# Patient Record
Sex: Female | Born: 1970 | Race: Asian | Hispanic: No | Marital: Married | Smoking: Never smoker
Health system: Southern US, Community
[De-identification: ages and names within clinical notes are randomized; demographics above are authoritative.]

## PROBLEM LIST (undated history)

## (undated) DIAGNOSIS — IMO0002 Reserved for concepts with insufficient information to code with codable children: Secondary | ICD-10-CM

## (undated) DIAGNOSIS — R87619 Unspecified abnormal cytological findings in specimens from cervix uteri: Secondary | ICD-10-CM

## (undated) DIAGNOSIS — Z789 Other specified health status: Secondary | ICD-10-CM

## (undated) HISTORY — PX: NO PAST SURGERIES: SHX2092

## (undated) HISTORY — PX: LEEP: SHX91

---

## 2009-10-07 ENCOUNTER — Emergency Department (HOSPITAL_COMMUNITY): Admission: EM | Admit: 2009-10-07 | Discharge: 2009-10-07 | Payer: Self-pay | Admitting: Family Medicine

## 2010-10-31 ENCOUNTER — Emergency Department (HOSPITAL_COMMUNITY)
Admission: EM | Admit: 2010-10-31 | Discharge: 2010-10-31 | Payer: Self-pay | Source: Home / Self Care | Admitting: Emergency Medicine

## 2010-10-31 ENCOUNTER — Emergency Department (HOSPITAL_COMMUNITY)
Admission: EM | Admit: 2010-10-31 | Discharge: 2010-10-31 | Disposition: A | Discharge status: Other Acute Inpatient Hospital | Payer: Self-pay | Source: Home / Self Care | Admitting: Family Medicine

## 2010-10-31 LAB — POCT URINALYSIS DIPSTICK
Nitrite: NEGATIVE
Urine Glucose, Fasting: NEGATIVE mg/dL

## 2010-11-01 LAB — OCCULT BLOOD, POC DEVICE: Fecal Occult Bld: POSITIVE

## 2011-01-28 LAB — RPR: RPR: NONREACTIVE

## 2011-01-28 LAB — ABO/RH

## 2011-01-28 LAB — ANTIBODY SCREEN: Antibody Screen: NEGATIVE

## 2011-01-28 LAB — HIV ANTIBODY (ROUTINE TESTING W REFLEX): HIV: NONREACTIVE

## 2011-02-28 ENCOUNTER — Other Ambulatory Visit: Payer: Self-pay | Admitting: Obstetrics and Gynecology

## 2011-02-28 DIAGNOSIS — Z0489 Encounter for examination and observation for other specified reasons: Secondary | ICD-10-CM

## 2011-02-28 DIAGNOSIS — O09529 Supervision of elderly multigravida, unspecified trimester: Secondary | ICD-10-CM

## 2011-03-26 ENCOUNTER — Ambulatory Visit (HOSPITAL_COMMUNITY)
Admission: RE | Admit: 2011-03-26 | Discharge: 2011-03-26 | Disposition: A | Discharge status: Home / Self Care | Payer: PRIVATE HEALTH INSURANCE | Source: Ambulatory Visit | Attending: Obstetrics and Gynecology | Admitting: Obstetrics and Gynecology

## 2011-03-26 ENCOUNTER — Ambulatory Visit (HOSPITAL_COMMUNITY): Payer: PRIVATE HEALTH INSURANCE

## 2011-03-26 DIAGNOSIS — Z0489 Encounter for examination and observation for other specified reasons: Secondary | ICD-10-CM

## 2011-03-26 DIAGNOSIS — IMO0002 Reserved for concepts with insufficient information to code with codable children: Secondary | ICD-10-CM

## 2011-03-26 DIAGNOSIS — O09519 Supervision of elderly primigravida, unspecified trimester: Secondary | ICD-10-CM | POA: Insufficient documentation

## 2011-03-26 DIAGNOSIS — Z3689 Encounter for other specified antenatal screening: Secondary | ICD-10-CM | POA: Insufficient documentation

## 2011-03-26 DIAGNOSIS — O09529 Supervision of elderly multigravida, unspecified trimester: Secondary | ICD-10-CM

## 2011-03-26 IMAGING — US US OB DETAIL+14 WK
2 series · 14 of 28 positions shown · non-contrast
Comparison: none

[Series 1: us ob detail+14 wk · 17 acquisitions, 2 frames shown (1 of 2)]
[im 5/17]
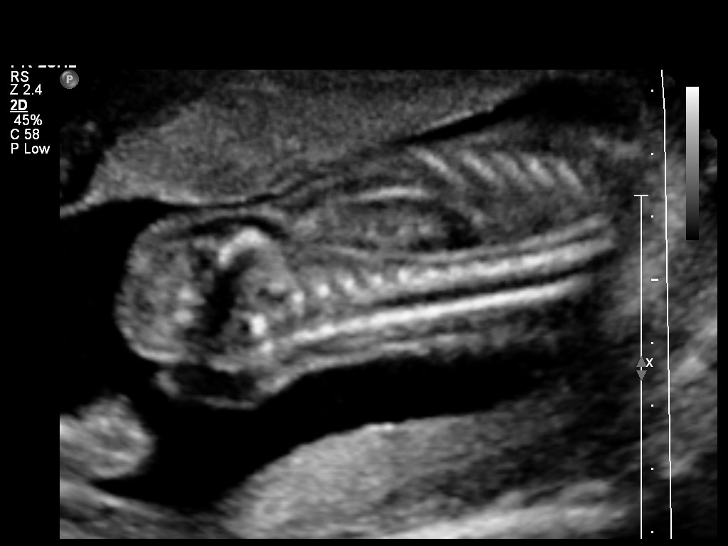
[im 13/17]
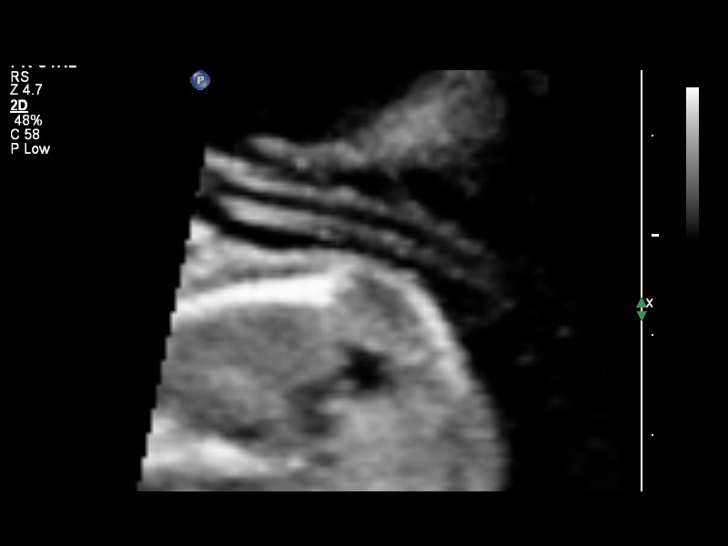

[Series 1: us ob detail+14 wk · 12 of 84 slices shown (2 of 2)]
[im 1/84]
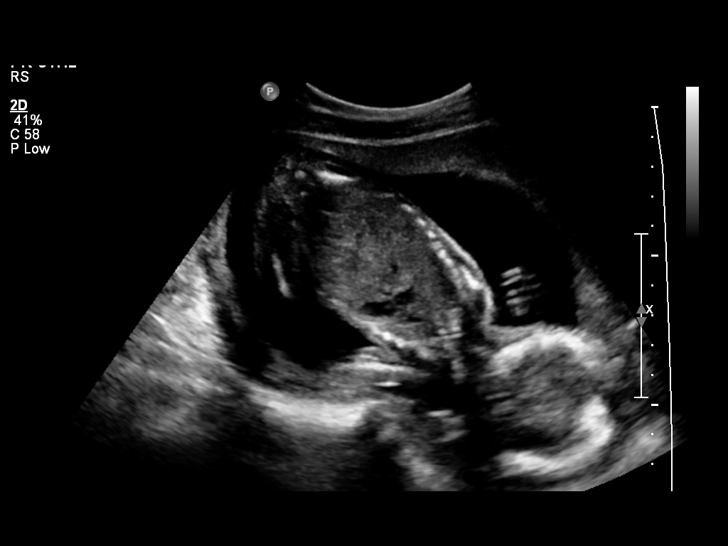
[im 8/84]
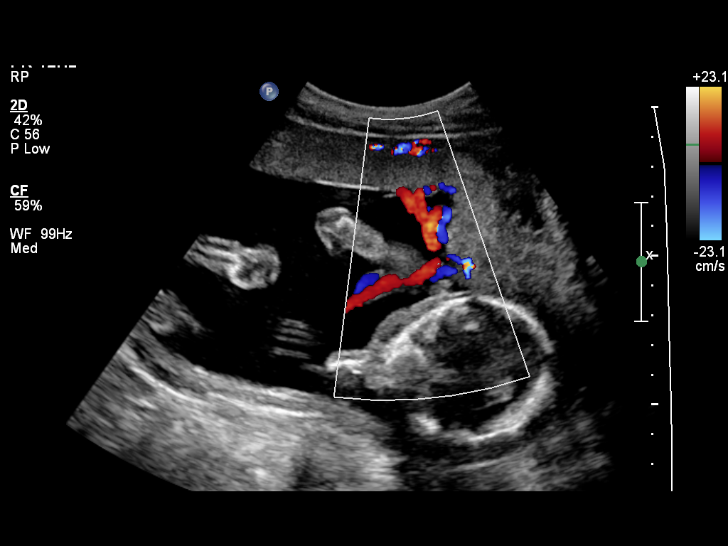
[im 16/84]
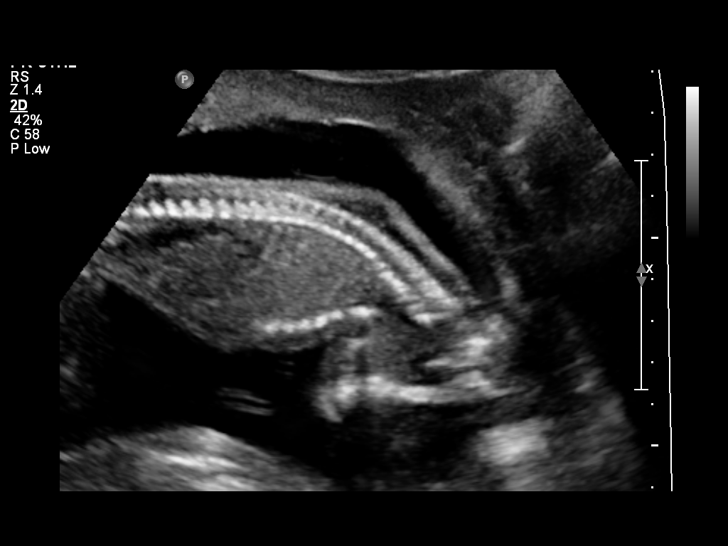
[im 23/84]
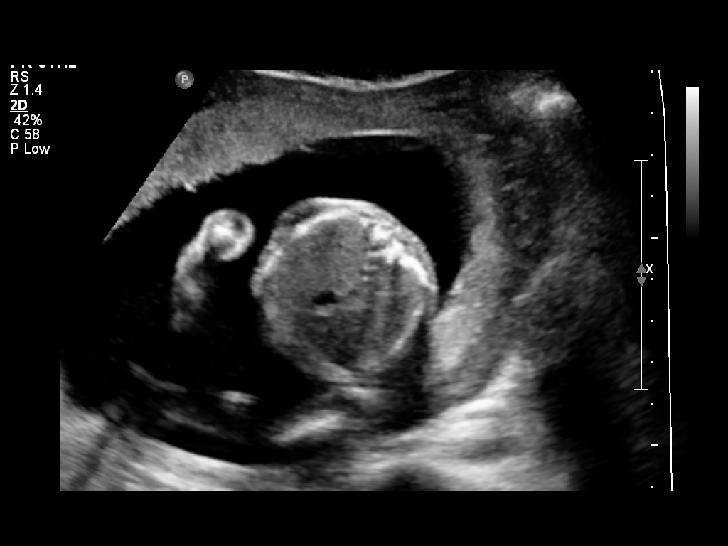
[im 31/84]
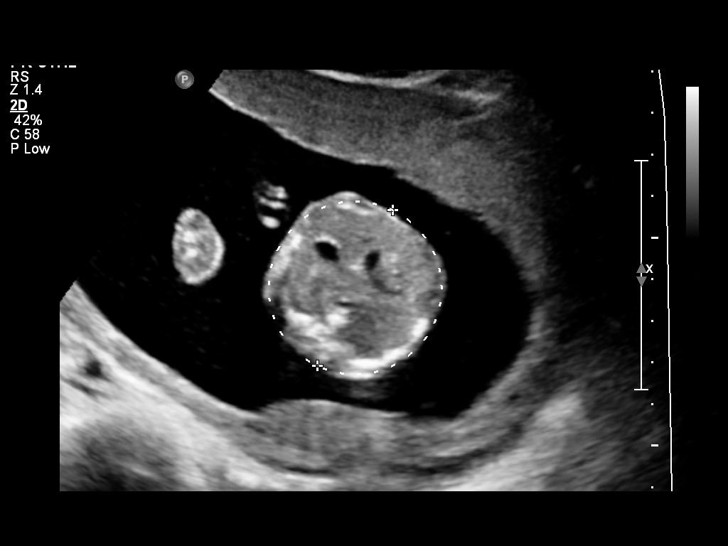
[im 38/84]
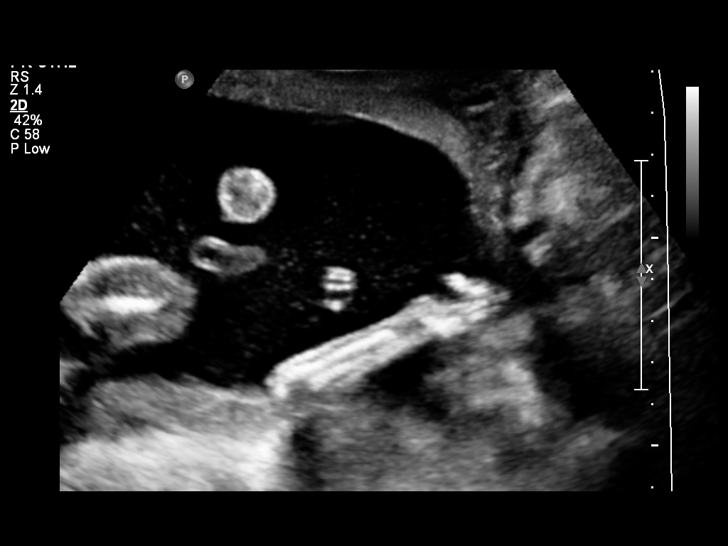
[im 46/84]
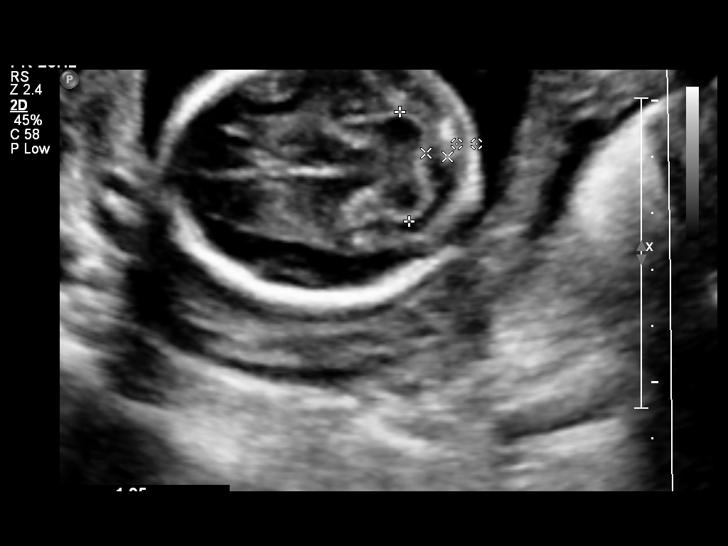
[im 53/84]
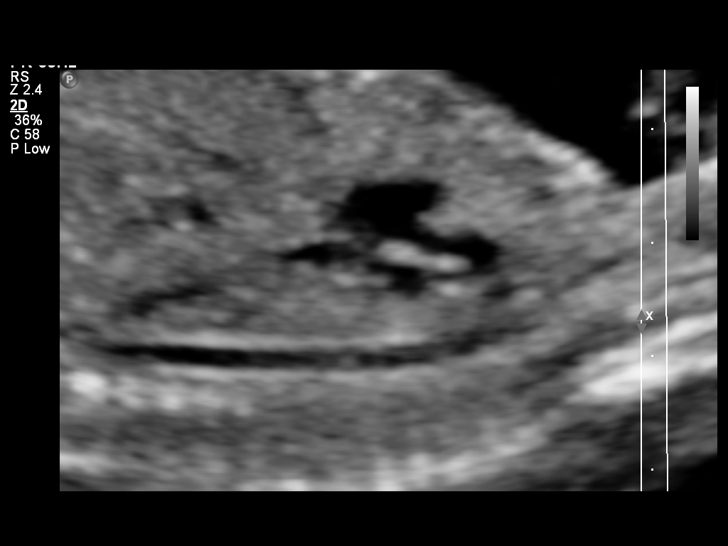
[im 61/84]
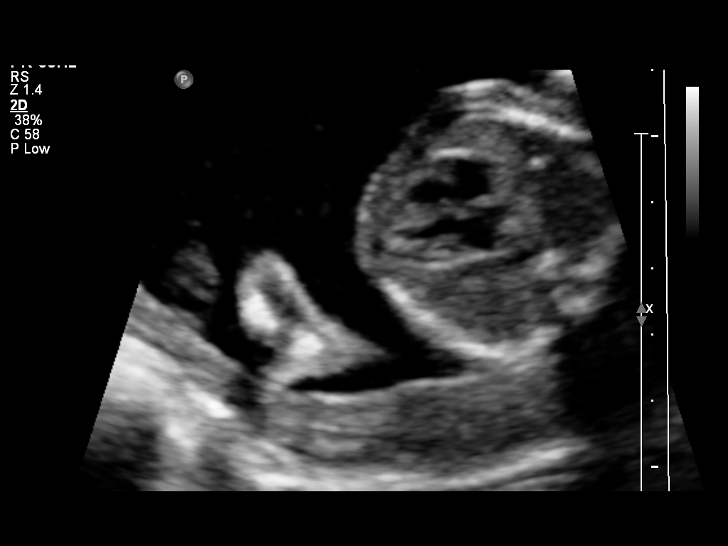
[im 68/84]
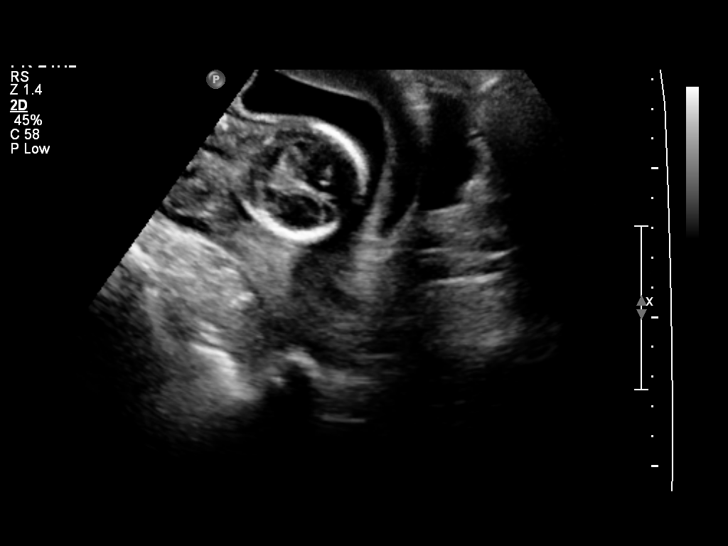
[im 76/84]
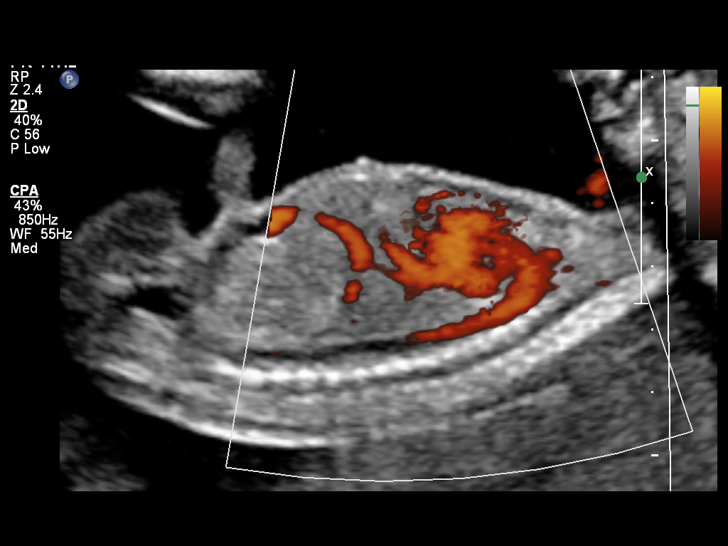
[im 84/84]
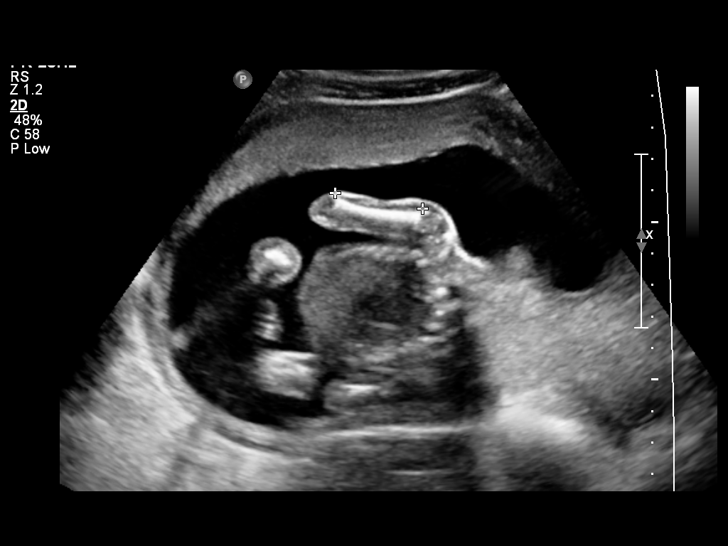

[14 of 28 positions shown; findings below may reference images not displayed]

Canned report from images found in remote index.

Refer to host system for actual result text.

## 2011-07-07 ENCOUNTER — Encounter (HOSPITAL_COMMUNITY): Payer: Self-pay | Admitting: *Deleted

## 2011-07-07 ENCOUNTER — Inpatient Hospital Stay (HOSPITAL_COMMUNITY)
Admission: AD | Admit: 2011-07-07 | Discharge: 2011-07-08 | Disposition: A | Discharge status: Home / Self Care | Payer: PRIVATE HEALTH INSURANCE | Source: Ambulatory Visit | Attending: Obstetrics and Gynecology | Admitting: Obstetrics and Gynecology

## 2011-07-07 DIAGNOSIS — Y92009 Unspecified place in unspecified non-institutional (private) residence as the place of occurrence of the external cause: Secondary | ICD-10-CM | POA: Insufficient documentation

## 2011-07-07 DIAGNOSIS — W010XXA Fall on same level from slipping, tripping and stumbling without subsequent striking against object, initial encounter: Secondary | ICD-10-CM | POA: Insufficient documentation

## 2011-07-07 DIAGNOSIS — O99891 Other specified diseases and conditions complicating pregnancy: Secondary | ICD-10-CM | POA: Insufficient documentation

## 2011-07-07 LAB — URINALYSIS, ROUTINE W REFLEX MICROSCOPIC
Ketones, ur: NEGATIVE mg/dL
Leukocytes, UA: NEGATIVE
Nitrite: NEGATIVE
Protein, ur: NEGATIVE mg/dL

## 2011-07-07 NOTE — H&P (Signed)
Chief Complaint:  Fall at home on wet floor.  HPI:  Patient is at 34wks who stated to me over the phone that she had fallen at approximately 8:30 p.m.  She had reached out her hands and arms and landed on the floor on her buttocks.  She denies abdominal trauma.  She denies any vaginal bleeding, leakage of fluid.  There has been no change in fetal movement.  Patient states she feels occasionally tightening of her lower abdomen, associated with fetal movement.  Patient reports no change in fetal movement since fall.  Vitals:  BP 150's/102 on admission.  This normalized to 130's/70's.  Otherwise, vss.  U/A sent and was negative for protein.  Abd:  Fundus nontender.  Pt thin and easy to monitor.  Palpable and copious audible fetal movement during exam.  NST reviewed and reactive.  No decels noted, multiple accels noted.  + variability.  Baseline approx 120's.    Pelvic exam deferred.  A/P:  Fall without abdominal trauma.  I spoke to the patient regarding her contractions and reassuring fetal status.  I stated that the main concern is that of abruption, which can be a dangerous condition for the patient and the fetus.  However, given her negative abdominal exam and reassuring fetal testing, the risk of this is minimized.  I discussed the possible s/s of abruption with the patient.  I offered her additional testing for the next several hours and if the patient has no complaints at approximately 12 midnight and continued reassuring fetal status, the patient may be discharged home.  She may return to work tomorrow, but if the notices any s/s of abruption, she is to return to the hospital.  I discussed the possibility of these contractions being present prior to the fall and that it is possible to have contractions at 34 weeks generally.  We will continue fetal surveillance for approximately one more hour and if reassuring, may d/c home.  Patient agrees with this plan.

## 2011-07-07 NOTE — Progress Notes (Signed)
Pt states she slipped in the kitchen on a wet spot on the floor and fell against the counter and caught herself on the pantry door and did not hit her stomach but landed on her bottom

## 2011-07-21 LAB — STREP B DNA PROBE: GBS: NEGATIVE

## 2011-08-14 ENCOUNTER — Inpatient Hospital Stay (HOSPITAL_COMMUNITY)
Admission: AD | Admit: 2011-08-14 | Discharge: 2011-08-17 | DRG: 774 | Disposition: A | Discharge status: Home / Self Care | Payer: PRIVATE HEALTH INSURANCE | Source: Ambulatory Visit | Attending: Obstetrics and Gynecology | Admitting: Obstetrics and Gynecology

## 2011-08-14 ENCOUNTER — Inpatient Hospital Stay (HOSPITAL_COMMUNITY): Payer: PRIVATE HEALTH INSURANCE | Admitting: Anesthesiology

## 2011-08-14 ENCOUNTER — Ambulatory Visit: Payer: PRIVATE HEALTH INSURANCE | Admitting: Internal Medicine

## 2011-08-14 ENCOUNTER — Encounter (HOSPITAL_COMMUNITY): Payer: Self-pay | Admitting: Anesthesiology

## 2011-08-14 ENCOUNTER — Encounter (HOSPITAL_COMMUNITY): Payer: Self-pay

## 2011-08-14 DIAGNOSIS — O99892 Other specified diseases and conditions complicating childbirth: Secondary | ICD-10-CM | POA: Diagnosis present

## 2011-08-14 DIAGNOSIS — O09519 Supervision of elderly primigravida, unspecified trimester: Secondary | ICD-10-CM | POA: Diagnosis present

## 2011-08-14 DIAGNOSIS — O1414 Severe pre-eclampsia complicating childbirth: Principal | ICD-10-CM | POA: Diagnosis present

## 2011-08-14 DIAGNOSIS — K208 Other esophagitis without bleeding: Secondary | ICD-10-CM | POA: Diagnosis present

## 2011-08-14 DIAGNOSIS — O212 Late vomiting of pregnancy: Secondary | ICD-10-CM | POA: Diagnosis not present

## 2011-08-14 HISTORY — DX: Unspecified abnormal cytological findings in specimens from cervix uteri: R87.619

## 2011-08-14 HISTORY — DX: Reserved for concepts with insufficient information to code with codable children: IMO0002

## 2011-08-14 HISTORY — DX: Other specified health status: Z78.9

## 2011-08-14 LAB — CBC
HCT: 43.5 % (ref 36.0–46.0)
MCH: 29.9 pg (ref 26.0–34.0)
MCHC: 32.7 g/dL (ref 30.0–36.0)
MCV: 91.5 fL (ref 78.0–100.0)
MCV: 91.4 fL (ref 78.0–100.0)
Platelets: 154 10*3/uL (ref 150–400)
RBC: 4.61 MIL/uL (ref 3.87–5.11)
RBC: 4.76 MIL/uL (ref 3.87–5.11)
RDW: 14.2 % (ref 11.5–15.5)
RDW: 14.2 % (ref 11.5–15.5)
WBC: 19.3 10*3/uL — ABNORMAL HIGH (ref 4.0–10.5)

## 2011-08-14 LAB — URINALYSIS, DIPSTICK ONLY
Glucose, UA: NEGATIVE mg/dL
Protein, ur: NEGATIVE mg/dL
Specific Gravity, Urine: >1.03 — ABNORMAL HIGH (ref 1.005–1.030)
pH: 6 (ref 5.0–8.0)

## 2011-08-14 LAB — COMPREHENSIVE METABOLIC PANEL
AST: 119 U/L — ABNORMAL HIGH (ref 0–37)
Albumin: 2.5 g/dL — ABNORMAL LOW (ref 3.5–5.2)
Albumin: 2.7 g/dL — ABNORMAL LOW (ref 3.5–5.2)
Alkaline Phosphatase: 384 U/L — ABNORMAL HIGH (ref 39–117)
BUN: 18 mg/dL (ref 6–23)
BUN: 17 mg/dL (ref 6–23)
Calcium: 10.1 mg/dL (ref 8.4–10.5)
Calcium: 10.2 mg/dL (ref 8.4–10.5)
Chloride: 99 mEq/L (ref 96–112)
Creatinine, Ser: 1.09 mg/dL (ref 0.50–1.10)
Creatinine, Ser: 1.05 mg/dL (ref 0.50–1.10)
GFR calc Af Amer: 73 mL/min — ABNORMAL LOW (ref >90)
Glucose, Bld: 108 mg/dL — ABNORMAL HIGH (ref 70–99)
Potassium: 4.1 mEq/L (ref 3.5–5.1)
Total Bilirubin: 0.3 mg/dL (ref 0.3–1.2)
Total Protein: 5.9 g/dL — ABNORMAL LOW (ref 6.0–8.3)
Total Protein: 6.5 g/dL (ref 6.0–8.3)

## 2011-08-14 LAB — URIC ACID: Uric Acid, Serum: 6.4 mg/dL (ref 2.4–7.0)

## 2011-08-14 LAB — LACTATE DEHYDROGENASE: LDH: 295 U/L — ABNORMAL HIGH (ref 94–250)

## 2011-08-14 LAB — RPR: RPR Ser Ql: NONREACTIVE

## 2011-08-14 MED ORDER — MAGNESIUM SULFATE BOLUS VIA INFUSION
4.0000 g | Freq: Once | INTRAVENOUS | Status: DC
  Filled 2011-08-14: qty 500

## 2011-08-14 MED ORDER — CITRIC ACID-SODIUM CITRATE 334-500 MG/5ML PO SOLN: 30.0000 mL | ORAL | Status: DC | PRN

## 2011-08-14 MED ORDER — EPHEDRINE 5 MG/ML INJ
10.0000 mg | INTRAVENOUS | Status: DC | PRN
  Filled 2011-08-14: qty 4

## 2011-08-14 MED ORDER — ONDANSETRON HCL 4 MG/2ML IJ SOLN
4.0000 mg | Freq: Four times a day (QID) | INTRAMUSCULAR | Status: DC | PRN
  Administered 2011-08-14 – 2011-08-15 (×2): 4 mg via INTRAVENOUS
  Filled 2011-08-14 (×2): qty 2

## 2011-08-14 MED ORDER — FENTANYL 2.5 MCG/ML BUPIVACAINE 1/10 % EPIDURAL INFUSION (WH - ANES)
14.0000 mL/h | INTRAMUSCULAR | Status: DC
  Administered 2011-08-14 – 2011-08-15 (×3): 14 mL/h via EPIDURAL
  Filled 2011-08-14 (×5): qty 60

## 2011-08-14 MED ORDER — OXYTOCIN 20 UNITS IN LACTATED RINGERS INFUSION - SIMPLE
1.0000 m[IU]/min | INTRAVENOUS | Status: DC
  Administered 2011-08-14: 2 m[IU]/min via INTRAVENOUS
  Administered 2011-08-15: 37.667 m[IU]/min via INTRAVENOUS
  Administered 2011-08-15: 333 m[IU]/min via INTRAVENOUS
  Filled 2011-08-14: qty 1000

## 2011-08-14 MED ORDER — FLEET ENEMA 7-19 GM/118ML RE ENEM: 1.0000 | ENEMA | RECTAL | Status: DC | PRN

## 2011-08-14 MED ORDER — DIPHENHYDRAMINE HCL 50 MG/ML IJ SOLN: 12.5000 mg | INTRAMUSCULAR | Status: DC | PRN

## 2011-08-14 MED ORDER — OXYTOCIN BOLUS FROM INFUSION
500.0000 mL | Freq: Once | INTRAVENOUS | Status: DC
  Filled 2011-08-14: qty 1000
  Filled 2011-08-14: qty 500

## 2011-08-14 MED ORDER — OXYCODONE-ACETAMINOPHEN 5-325 MG PO TABS: 2.0000 | ORAL_TABLET | ORAL | Status: DC | PRN

## 2011-08-14 MED ORDER — BUTORPHANOL TARTRATE 2 MG/ML IJ SOLN
1.0000 mg | INTRAMUSCULAR | Status: DC | PRN
  Administered 2011-08-14 (×2): 1 mg via INTRAVENOUS
  Filled 2011-08-14 (×2): qty 1

## 2011-08-14 MED ORDER — IBUPROFEN 600 MG PO TABS: 600.0000 mg | ORAL_TABLET | Freq: Four times a day (QID) | ORAL | Status: DC | PRN

## 2011-08-14 MED ORDER — LIDOCAINE HCL 1.5 % IJ SOLN
INTRAMUSCULAR | Status: DC | PRN
  Administered 2011-08-14 (×2): 5 mL via EPIDURAL

## 2011-08-14 MED ORDER — LACTATED RINGERS IV SOLN
INTRAVENOUS | Status: DC
  Administered 2011-08-14 – 2011-08-15 (×3): via INTRAVENOUS

## 2011-08-14 MED ORDER — LACTATED RINGERS IV SOLN: 500.0000 mL | Freq: Once | INTRAVENOUS | Status: DC

## 2011-08-14 MED ORDER — ACETAMINOPHEN 325 MG PO TABS: 650.0000 mg | ORAL_TABLET | ORAL | Status: DC | PRN

## 2011-08-14 MED ORDER — LACTATED RINGERS IV SOLN
500.0000 mL | INTRAVENOUS | Status: DC | PRN
  Administered 2011-08-14: 500 mL via INTRAVENOUS

## 2011-08-14 MED ORDER — OXYTOCIN 20 UNITS IN LACTATED RINGERS INFUSION - SIMPLE: 125.0000 mL/h | Freq: Once | INTRAVENOUS | Status: DC

## 2011-08-14 MED ORDER — TERBUTALINE SULFATE 1 MG/ML IJ SOLN: 0.2500 mg | Freq: Once | INTRAMUSCULAR | Status: AC | PRN

## 2011-08-14 MED ORDER — EPHEDRINE 5 MG/ML INJ: 10.0000 mg | INTRAVENOUS | Status: DC | PRN

## 2011-08-14 MED ORDER — LIDOCAINE HCL (PF) 1 % IJ SOLN: 30.0000 mL | INTRAMUSCULAR | Status: DC | PRN

## 2011-08-14 MED ORDER — MAGNESIUM SULFATE 40 G IN LACTATED RINGERS - SIMPLE
1.0000 g/h | INTRAVENOUS | Status: DC
  Administered 2011-08-14: 2 g/h via INTRAVENOUS
  Administered 2011-08-15 (×3): 1 g/h via INTRAVENOUS
  Filled 2011-08-14: qty 500

## 2011-08-14 MED ORDER — PHENYLEPHRINE 40 MCG/ML (10ML) SYRINGE FOR IV PUSH (FOR BLOOD PRESSURE SUPPORT)
80.0000 ug | PREFILLED_SYRINGE | INTRAVENOUS | Status: DC | PRN
  Filled 2011-08-14: qty 5

## 2011-08-14 MED ORDER — PHENYLEPHRINE 40 MCG/ML (10ML) SYRINGE FOR IV PUSH (FOR BLOOD PRESSURE SUPPORT): 80.0000 ug | PREFILLED_SYRINGE | INTRAVENOUS | Status: DC | PRN

## 2011-08-14 NOTE — Anesthesia Preprocedure Evaluation (Signed)
Anesthesia Evaluation  Patient identified by MRN, date of birth, ID band Patient awake    Reviewed: Allergy & Precautions, H&P , Patient's Chart, lab work & pertinent test results  Airway Mallampati: II TM Distance: >3 FB Neck ROM: full    Dental No notable dental hx.    Pulmonary neg pulmonary ROS,  clear to auscultation  Pulmonary exam normal       Cardiovascular neg cardio ROS regular Normal    Neuro/Psych Negative Neurological ROS  Negative Psych ROS   GI/Hepatic negative GI ROS, Neg liver ROS,   Endo/Other  Negative Endocrine ROS  Renal/GU negative Renal ROS     Musculoskeletal   Abdominal   Peds  Hematology negative hematology ROS (+)   Anesthesia Other Findings Severe preeclampsia  Reproductive/Obstetrics (+) Pregnancy                           Anesthesia Physical Anesthesia Plan  ASA: III  Anesthesia Plan: Epidural   Post-op Pain Management:    Induction:   Airway Management Planned:   Additional Equipment:   Intra-op Plan:   Post-operative Plan:   Informed Consent: I have reviewed the patients History and Physical, chart, labs and discussed the procedure including the risks, benefits and alternatives for the proposed anesthesia with the patient or authorized representative who has indicated his/her understanding and acceptance.     Plan Discussed with:   Anesthesia Plan Comments:         Anesthesia Quick Evaluation

## 2011-08-14 NOTE — Progress Notes (Signed)
PT HAS ROOM ASSIGNMENT 173. MD WILL PLACE ORDERS IN 10 MIN. H MITCHELL CHARGE ON RN NOTIFIED.

## 2011-08-14 NOTE — Progress Notes (Signed)
B/P 140-150/DIAST 110/115 REACTIVE STRIP. MD WILL PLACE ORDERS FOR ADMIT.

## 2011-08-14 NOTE — Progress Notes (Signed)
Pt states on tx she feels a gush of warm fluid. Small amount of pink, clear fluid seen on pad. Cleone Slim RN notified.

## 2011-08-14 NOTE — Progress Notes (Addendum)
Helen Russell is a 40 y.o. G1P0 at [redacted]w[redacted]d admitted for severe preeclampsia by blood pressure and labs.  Started on Magnesium Sulfate at 1800. Subjective: Pt denies HA, visual changes, abdominal pain other than contractions.  She reports nausea/vomiting at the time of mechanical cervical dilatation.  Objective: BP 148/73  Pulse 80  Temp(Src) 98 F (36.7 C) (Oral)  Resp 18  Ht 5' 6.5" (1.689 m)  Wt 65.772 kg (145 lb)  BMI 23.05 kg/m2  SpO2 98%   Total I/O In: 120 [P.O.:120] Out: -   FHT:  FHR: 110s bpm, variability: moderate,  accelerations:  Present,  decelerations:  Present mild variables UC:   Unable to assess due to pt positioning.  Q 1-3 after IUPC placed SVE:   Dilation: Fingertip Effacement (%): 70 Station: -1 Exam by:: Montez Morita, RNC  2-3/90/-1, occiput well applied.  IUPC placed without difficulty. Lungs: CTA bilaterally Abdomen: no RUQ tenderness Neuro: 2-3 + DTR  Labs: Lab Results  Component Value Date   WBC 11.8* 08/14/2011   HGB 13.8 08/14/2011   HCT 42.2 08/14/2011   MCV 91.5 08/14/2011   PLT 154 08/14/2011    Assessment / Plan: Severe preeclampsia  Labor: On pitocin.  No change in 2 hours. IUPC placed to monitor pattern and MVUs. Preeclampsia:  on magnesium sulfate, no signs or symptoms of toxicity and Repeating labs now. Fetal Wellbeing:  Category I Pain Control:  Stadol for now.  Pt counseled on contraindication of epidural with thrombocytopenia. I/D:  n/a Anticipated MOD:  NSVD  Deatrice Spanbauer 08/14/2011, 7:52 PM

## 2011-08-14 NOTE — H&P (Signed)
Admission H&P:  Patient is a 40 y.o.G1P0 at [redacted]w[redacted]d who presented to the clinic today for routine exam and was noted to have bp's of 156/106 with repeat of 138/90.  Patient was noted to have reactive NST (routine testing given AMA).  Referred to hospital for induction of labor for PIH v. PreX.  No change in vision, h/a, change in vision.    PMH:  Rubella non-immune.  Allergic to rubbing alcohol. PSH:  LEEP PGYN:  + h/o abnormal paps.  No h/o other STD's.  POB:  G1P0. Allergies:  Rubbing alcohol. Soc:  No a/t/d/ Fam:  Non-contrib.    Prenatal Labs:  O+/RNI/NR/pap wnl.  BP max 164 systolic, 115 diastolic.  More recently decreased to 153/83, 164/94, 154/99, 142/101.   Otherwise vss and wnl. Bedside u/s:  Cephalic. Cervix closed with a small dimple-like structure at os; small tuft of hair visible through os.  80%, -1.   Leop[place]:  approx 7 lbs.    Procedure Note:  Given scar tissue noted on exam likely secondary to a LEEP, after discussing with patient the option of breaking up this tissue, the patient opted to proceed with disruption thereof.  After cephalic position was confirmed by u/s, sterile speculum was inserted into vagina.  Copious clear fluid in the vagina drained.  Hair per os then noted.  Ring forceps inserted under direct visualization of os but these were too wide for os to accommodate.  Kelley clamp then gently inserted and opened to max of 2 cm horizontally and vertically with care to avoid performing this action during a contraction so that fetal head would not be under pressure against the cervix.  Mild amount of bleeding noted from cervix.  After speculum removed, cervix noted to be 1 cm.  Process repeated again under direct visualization with speculum and cervix thereafter noted to 2 cm/90/-1.  Reactive NST.  + accels, no current decels.  + variability.  1 possibly prolonged decel previously.    Labs:  Plt 154.  AST  118, ALT 163.  Cr. 1.09.  Uric A 6.5  A/P:  1)  Term  pregnancy with PreX/HELLP  Induction with pitocin. Magnesium 4 g load then 2 g / hr written. Monitor strict I's and O's.   2)  H/o LEEP:  Scar tissue disrupted and patient dilated from closed to 2 cm.    3)  GBS neg:  No prophylaxis recommended.  4)  Rubella non-immune.  Immunize postpartum.  5)  Pain:  Discussed IV meds v. Epidural.  Given low plt, d/w Dr. Dion Body:  Desires repeat labs prior to ensure no decrease in Plt count.    This patient was discussed with Dr. Dion Body and signed out to her.

## 2011-08-14 NOTE — Progress Notes (Signed)
Patient states she was seen in the office this am and sent for IOL. Has had elevated blood pressure and bloody show. Patient will be held in MAU until bed availble in BS.

## 2011-08-14 NOTE — Anesthesia Procedure Notes (Signed)

## 2011-08-15 ENCOUNTER — Encounter (HOSPITAL_COMMUNITY): Payer: Self-pay | Admitting: *Deleted

## 2011-08-15 LAB — COMPREHENSIVE METABOLIC PANEL
AST: 70 U/L — ABNORMAL HIGH (ref 0–37)
Albumin: 2 g/dL — ABNORMAL LOW (ref 3.5–5.2)
Chloride: 99 mEq/L (ref 96–112)
Creatinine, Ser: 1.46 mg/dL — ABNORMAL HIGH (ref 0.50–1.10)
Potassium: 4 mEq/L (ref 3.5–5.1)
Total Bilirubin: 0.2 mg/dL — ABNORMAL LOW (ref 0.3–1.2)

## 2011-08-15 LAB — CBC
HCT: 36.7 % (ref 36.0–46.0)
MCHC: 32.4 g/dL (ref 30.0–36.0)
MCV: 92.3 fL (ref 78.0–100.0)
Platelets: 161 10*3/uL (ref 150–400)
Platelets: 149 10*3/uL — ABNORMAL LOW (ref 150–400)
RDW: 14.6 % (ref 11.5–15.5)
RDW: 14.4 % (ref 11.5–15.5)
WBC: 26.5 10*3/uL — ABNORMAL HIGH (ref 4.0–10.5)

## 2011-08-15 LAB — DIFFERENTIAL
Basophils Relative: 0 % (ref 0–1)
Eosinophils Relative: 0 % (ref 0–5)
Monocytes Relative: 2 % — ABNORMAL LOW (ref 3–12)
Myelocytes: 0 %
Neutrophils Relative %: 90 % — ABNORMAL HIGH (ref 43–77)

## 2011-08-15 LAB — MAGNESIUM: Magnesium: 8 mg/dL (ref 1.5–2.5)

## 2011-08-15 LAB — ABO/RH: ABO/RH(D): O POS

## 2011-08-15 MED ORDER — MEASLES, MUMPS & RUBELLA VAC ~~LOC~~ INJ
0.5000 mL | INJECTION | Freq: Once | SUBCUTANEOUS | Status: AC
  Administered 2011-08-18: 0.5 mL via SUBCUTANEOUS
  Filled 2011-08-15: qty 0.5

## 2011-08-15 MED ORDER — TETANUS-DIPHTH-ACELL PERTUSSIS 5-2.5-18.5 LF-MCG/0.5 IM SUSP
0.5000 mL | Freq: Once | INTRAMUSCULAR | Status: DC
  Filled 2011-08-15: qty 0.5

## 2011-08-15 MED ORDER — MAGNESIUM SULFATE 40 G IN LACTATED RINGERS - SIMPLE
1.0000 g/h | INTRAVENOUS | Status: DC
  Administered 2011-08-15: 1 g/h via INTRAVENOUS
  Filled 2011-08-15 (×2): qty 500

## 2011-08-15 MED ORDER — HYDRALAZINE HCL 20 MG/ML IJ SOLN: 10.0000 mg | INTRAMUSCULAR | Status: DC | PRN

## 2011-08-15 MED ORDER — SIMETHICONE 80 MG PO CHEW: 80.0000 mg | CHEWABLE_TABLET | ORAL | Status: DC | PRN

## 2011-08-15 MED ORDER — SENNOSIDES-DOCUSATE SODIUM 8.6-50 MG PO TABS
2.0000 | ORAL_TABLET | Freq: Every day | ORAL | Status: DC
  Administered 2011-08-15 – 2011-08-16 (×2): 2 via ORAL

## 2011-08-15 MED ORDER — IBUPROFEN 600 MG PO TABS: 600.0000 mg | ORAL_TABLET | Freq: Four times a day (QID) | ORAL | Status: DC

## 2011-08-15 MED ORDER — DIPHENHYDRAMINE HCL 25 MG PO CAPS: 25.0000 mg | ORAL_CAPSULE | Freq: Four times a day (QID) | ORAL | Status: DC | PRN

## 2011-08-15 MED ORDER — DIBUCAINE 1 % RE OINT: 1.0000 "application " | TOPICAL_OINTMENT | RECTAL | Status: DC | PRN

## 2011-08-15 MED ORDER — PRENATAL PLUS 27-1 MG PO TABS
1.0000 | ORAL_TABLET | Freq: Every day | ORAL | Status: DC
  Administered 2011-08-16 – 2011-08-18 (×2): 1 via ORAL
  Filled 2011-08-15 (×2): qty 1

## 2011-08-15 MED ORDER — LANOLIN HYDROUS EX OINT: TOPICAL_OINTMENT | CUTANEOUS | Status: DC | PRN

## 2011-08-15 MED ORDER — WITCH HAZEL-GLYCERIN EX PADS: 1.0000 "application " | MEDICATED_PAD | CUTANEOUS | Status: DC | PRN

## 2011-08-15 MED ORDER — ONDANSETRON HCL 4 MG PO TABS: 4.0000 mg | ORAL_TABLET | ORAL | Status: DC | PRN

## 2011-08-15 MED ORDER — ONDANSETRON HCL 4 MG/2ML IJ SOLN: 4.0000 mg | INTRAMUSCULAR | Status: DC | PRN

## 2011-08-15 MED ORDER — CARBOPROST TROMETHAMINE 250 MCG/ML IM SOLN
INTRAMUSCULAR | Status: AC
  Filled 2011-08-15: qty 1

## 2011-08-15 MED ORDER — MEDROXYPROGESTERONE ACETATE 150 MG/ML IM SUSP: 150.0000 mg | INTRAMUSCULAR | Status: DC | PRN

## 2011-08-15 MED ORDER — HYDRALAZINE HCL 20 MG/ML IJ SOLN: 10.0000 mg | INTRAMUSCULAR | Status: DC

## 2011-08-15 MED ORDER — BENZOCAINE-MENTHOL 20-0.5 % EX AERO: 1.0000 "application " | INHALATION_SPRAY | CUTANEOUS | Status: DC | PRN

## 2011-08-15 MED ORDER — LABETALOL HCL 5 MG/ML IV SOLN: 10.0000 mg | Freq: Once | INTRAVENOUS | Status: DC

## 2011-08-15 MED ORDER — OXYCODONE-ACETAMINOPHEN 5-325 MG PO TABS
1.0000 | ORAL_TABLET | ORAL | Status: DC | PRN
  Administered 2011-08-16 (×2): 1 via ORAL
  Filled 2011-08-15 (×2): qty 1

## 2011-08-15 MED ORDER — OXYTOCIN 10 UNIT/ML IJ SOLN
INTRAMUSCULAR | Status: AC
  Filled 2011-08-15: qty 1

## 2011-08-15 MED ORDER — MISOPROSTOL 200 MCG PO TABS
ORAL_TABLET | ORAL | Status: AC
  Filled 2011-08-15: qty 5

## 2011-08-15 MED ORDER — CEFAZOLIN SODIUM 1-5 GM-% IV SOLN
1.0000 g | Freq: Three times a day (TID) | INTRAVENOUS | Status: DC
  Administered 2011-08-15 – 2011-08-17 (×5): 1 g via INTRAVENOUS
  Filled 2011-08-15 (×6): qty 50

## 2011-08-15 MED ORDER — ZOLPIDEM TARTRATE 5 MG PO TABS: 5.0000 mg | ORAL_TABLET | Freq: Every evening | ORAL | Status: DC | PRN

## 2011-08-15 MED ORDER — LACTATED RINGERS IV SOLN
INTRAVENOUS | Status: DC
  Administered 2011-08-15 – 2011-08-17 (×3): via INTRAVENOUS

## 2011-08-15 NOTE — Progress Notes (Signed)
UR chart review completed.  

## 2011-08-15 NOTE — Progress Notes (Signed)
Helen Russell is a 40 y.o. G1P0 at [redacted]w[redacted]d  admitted for induction of labor due to Pre-eclamptic toxemia of pregnancy, severe.  Subjective: Pt sleeping quietly.  Denies HA, visual changes.  Reports one more episode of emesis.   Objective: BP 124/69  Pulse 88  Temp(Src) 97.7 F (36.5 C) (Oral)  Resp 18  Ht 5' 6.5" (1.689 m)  Wt 65.772 kg (145 lb)  BMI 23.05 kg/m2  SpO2 98% I/O last 3 completed shifts: In: 698 [I.V.:698] Out: -  Total I/O In: 2516.8 [P.O.:660; I.V.:1856.8] Out: 1050 [Urine:1050]  100 in the last hour  FHT:  FHR: 140s bpm, variability: moderate,  accelerations:  Present,  decelerations:  Present late decelerations resolved UC:   Tripling.  MVU 170 in the last hour  SVE:   Dilation: 7.5 Effacement (%): 90 Station: +1 Exam by:: H.Norton, RN  Above exam was done at 6:20am  Labs: Lab Results  Component Value Date   WBC 19.3* 08/14/2011   HGB 14.4 08/14/2011   HCT 43.5 08/14/2011   MCV 91.4 08/14/2011   PLT 148* 08/14/2011    Assessment / Plan: Protracted active phase Check magnesium level.  Increase Pitocin until adequate MVUs.  Labor: protracted but inadequate MVUs. Preeclampsia:  on magnesium sulfate and adequate UOP.  Due to somnolence and inadequate MVUs will check stat Mg level. Fetal Wellbeing:  Category I Pain Control:  Epidural I/D:  Prolonged ROM.  No s/s chorioamnionits. Anticipated MOD:  NSVD if adequate contractions.  Helen Russell 08/15/2011, 6:44 AM

## 2011-08-15 NOTE — Consult Note (Signed)
Called to AICU for patient with high blood pressure and vomiting upon transfer from L&D to AICU.  Patient had SVD with epidural earlier today and has preeclampsia on magnesium drip.  She arrived in AICU vomiting, shivering and with decreased LOC.  On my arrival BP was 198/118 and 161/119 with HR 113, patient was sedated but arousable and oriented.  I ordered a dose of labetalol IV 10 mg. BP lowered to 142/82 and HR to 88.  Patient more alert though still sedated.  No difficulty breathing, o2 sat 98% on RA.  Dr Neva Seat has been called.  Care over to Dr. Neva Seat and [facility] staff.  Jasmine December, MD

## 2011-08-15 NOTE — Significant Event (Signed)
Pt transferred to AICU via wheelchair @ 1500.  Pt began to vomit large amout of brown liquid and became very lethargic.  Pt brought to AICU bed and was lethargic but arousable with a little bit of generalized shaking noted.  Dr. Rodman Pickle called to room and Dr Chilton Si notified.  Magnesium level called while patient in transfer and was reported to be 8.0.  Magnesium down to 1mg  per MD order.  Tia Alert, RNC assumed care.

## 2011-08-15 NOTE — Progress Notes (Addendum)
Post Partum Day 0  Subjective:  Mental status change, hypertension, vomiting blood On route to AICU for postpartum magnesium sulfate prophylaxis for PIH, patient became obtunded and hypertensive (200/100) after an episode of nausea and vomiting of blood.  She also gave a history of vomiting dark fluid 4 to 5 times yesterday.   Objective: Blood pressure 148/94, pulse 101, temperature 99 F (37.2 C), temperature source Oral, resp. rate 18, height 5' 6.5" (1.689 m), weight 145 lb (65.772 kg), SpO2 98.00%, unknown if currently breastfeeding.  Currently the patient is oriented, and appropriate in responses.  BP has decreased with Labetolol 10 mg IV x1.  Physical Exam:  HEENT nl Lungs clear S1 S2 Clear abd BS present Lochia: appropriate Uterine Fundus:  Firm Episiotomy, laceration : nl DVT Evaluation:  negative   Basename 08/15/11 1404 08/14/11 2015  HGB 11.9* 14.4  HCT 36.7 43.5    Assessment/Plan: S/p NSVD around noon today Severe PIH, on Magnesium Sulfate Nausea and vomiting heme colored material x 2 days P: Continue Magnesium sulfate CBC, CMP GI consult Dr Janifer Adie called.   LOS: 1 day   Ilya Ess E 08/15/2011, 5:00 PM    9

## 2011-08-16 DIAGNOSIS — D62 Acute posthemorrhagic anemia: Secondary | ICD-10-CM

## 2011-08-16 DIAGNOSIS — K922 Gastrointestinal hemorrhage, unspecified: Secondary | ICD-10-CM

## 2011-08-16 LAB — COMPREHENSIVE METABOLIC PANEL
ALT: 65 U/L — ABNORMAL HIGH (ref 0–35)
Alkaline Phosphatase: 228 U/L — ABNORMAL HIGH (ref 39–117)
BUN: 19 mg/dL (ref 6–23)
CO2: 28 mEq/L (ref 19–32)
Chloride: 99 mEq/L (ref 96–112)
GFR calc Af Amer: 62 mL/min — ABNORMAL LOW (ref >90)
Glucose, Bld: 113 mg/dL — ABNORMAL HIGH (ref 70–99)
Potassium: 3.9 mEq/L (ref 3.5–5.1)
Total Bilirubin: 0.2 mg/dL — ABNORMAL LOW (ref 0.3–1.2)

## 2011-08-16 LAB — CBC
HCT: 28.2 % — ABNORMAL LOW (ref 36.0–46.0)
Hemoglobin: 9.4 g/dL — ABNORMAL LOW (ref 12.0–15.0)
MCHC: 33.3 g/dL (ref 30.0–36.0)
RBC: 3.07 MIL/uL — ABNORMAL LOW (ref 3.87–5.11)
WBC: 18 10*3/uL — ABNORMAL HIGH (ref 4.0–10.5)

## 2011-08-16 MED ORDER — SODIUM CHLORIDE 0.9 % IJ SOLN
3.0000 mL | Freq: Two times a day (BID) | INTRAMUSCULAR | Status: DC
  Administered 2011-08-16: 3 mL via INTRAVENOUS

## 2011-08-16 MED ORDER — PANTOPRAZOLE SODIUM 40 MG PO TBEC
40.0000 mg | DELAYED_RELEASE_TABLET | Freq: Every day | ORAL | Status: DC
  Administered 2011-08-16 – 2011-08-18 (×3): 40 mg via ORAL
  Filled 2011-08-16 (×4): qty 1

## 2011-08-16 MED ORDER — PANTOPRAZOLE SODIUM 40 MG PO TBEC
40.0000 mg | DELAYED_RELEASE_TABLET | Freq: Every day | ORAL | Status: DC
  Filled 2011-08-16: qty 1

## 2011-08-16 NOTE — Progress Notes (Addendum)
Post Partum Day 1 Subjective: no complaints today.  GI Physician assistant here.  Objective: Blood pressure 132/64, pulse 101, temperature 98.7 F (37.1 C), temperature source Oral, resp. rate 20, height 5' 6.5" (1.689 m), weight 145 lb (65.772 kg), SpO2 99.00%, unknown if currently breastfeeding.  Physical Exam:  General: alert, cooperative and no distress Lochia: appropriate Uterine Fundus: firm Episiotomy, laceration : no significant drainage DVT Evaluation: No evidence of DVT seen on physical exam.   Basename 08/16/11 0539 08/15/11 1713  HGB 9.4* 11.4*  HCT 28.2* 34.7*  AST 46 (as 70)  ALT 65 (was 103) Creatinine 1.25 (was 1.46)  Assessment/Plan: GI consult obtained due to vomiting dark emesis for past two days.  P: Endoscopy in the morning. Severe PIH improving Plan for discharge tomorrow after endoscopy. Transfer to postpartum.    LOS: 2 days   Mersadies Petree E 08/16/2011, 12:43 PM

## 2011-08-16 NOTE — Anesthesia Postprocedure Evaluation (Signed)
  Anesthesia Post-op Note  Patient: Helen Russell  Procedure(s) Performed: * No procedures listed *  Patient Location: [facility] Unit  Anesthesia Type: Epidural  Level of Consciousness: awake and alert   Airway and Oxygen Therapy: Patient Spontanous Breathing  Post-op Assessment: Patient's Cardiovascular Status Stable and Respiratory Function Stable  Post-op Vital Signs: Reviewed and stable  Complications: No apparent anesthesia complications

## 2011-08-16 NOTE — Consult Note (Signed)
Referring Provider: Dr. Almyra Free Care Physician:  No primary provider on file. Primary Gastroenterologist: unassigned  Reason for Consultation:  Vomiting blood  HPI: Helen Russell is a 40 y.o. female who delivered a baby boy yesterday. She was admitted on 08/14/2011 with pregnancy-induced hypertension, preeclampsia. She relates having onset of nausea and vomiting on Thursday and apparently vomited multiple times each of these episodes with black or dark brown material, no bright red blood. Yesterday after she delivered her baby she was getting up in the wheelchair to transfer floors and became nauseated again and vomited up a large amount of black material. She says she hasn't had any further vomiting since then feels much better today. She has not had a bowel movement since 08/14/2011. She has eaten today and denies any heartburn indigestion dysphagia or odynophagia. She had been having some heartburn over the past month and had been using times when necessary. She had not been using any aspirin or NSAIDs. She has no prior history of any GI problems.  Hemoglobin on admission on 11 8 was 14.4 hemoglobin today is 9.4.   Past Medical History  Diagnosis Date  . No pertinent past medical history   . Abnormal Pap smear     LEEP 1996    Past Surgical History  Procedure Date  . Leep     1996  . No past surgeries     Prior to Admission medications   Not on File    Current Facility-Administered Medications  Medication Dose Route Frequency Provider Last Rate Last Dose  . benzocaine-Menthol (DERMOPLAST) 20-0.5 % topical spray 1 application  1 application Topical PRN Fortino Sic, MD      . ceFAZolin (ANCEF) IVPB 1 g/50 mL premix  1 g Intravenous Q8H Fortino Sic, MD   1 g at 08/16/11 1047  . witch hazel-glycerin (TUCKS) pad 1 application  1 application Topical PRN Fortino Sic, MD       And  . dibucaine (NUPERCAINAL) 1 % rectal ointment 1 application  1  application Rectal PRN Fortino Sic, MD      . diphenhydrAMINE (BENADRYL) capsule 25 mg  25 mg Oral Q6H PRN Fortino Sic, MD      . hydrALAZINE (APRESOLINE) injection 10 mg  10 mg Intravenous Q20 Min PRN Fortino Sic, MD      . labetalol (NORMODYNE,TRANDATE) injection 10 mg  10 mg Intravenous Once Amy L. Rodman Pickle, MD      . lactated ringers infusion   Intravenous Continuous Fortino Sic, MD 112.5 mL/hr at 08/16/11 0800    . lanolin ointment   Topical PRN Fortino Sic, MD      . magnesium sulfate 40 grams in LR 500 mL infusion  1 g/hr Intravenous Continuous Fortino Sic, MD 12.5 mL/hr at 08/16/11 0800 1 g/hr at 08/16/11 0800  . measles, mumps and rubella vaccine (MMR) injection 0.5 mL  0.5 mL Subcutaneous Once Fortino Sic, MD      . medroxyPROGESTERone (DEPO-PROVERA) injection 150 mg  150 mg Intramuscular Prior to discharge Fortino Sic, MD      . ondansetron [facility]) tablet 4 mg  4 mg Oral Q4H PRN Fortino Sic, MD       Or  . ondansetron [facility]) injection 4 mg  4 mg Intravenous Q4H PRN Fortino Sic, MD      . oxyCODONE-acetaminophen (PERCOCET) 5-325 MG per tablet 1-2 tablet  1-2 tablet Oral Q3H PRN Lou Miner  Neva Seat, MD      . prenatal vitamin w/FE, FA (PRENATAL 1 + 1) 27-1 MG tablet 1 tablet  1 tablet Oral Daily Fortino Sic, MD   1 tablet at 08/16/11 1040  . senna-docusate (Senokot-S) tablet 2 tablet  2 tablet Oral QHS Fortino Sic, MD   2 tablet at 08/15/11 2205  . simethicone (MYLICON) chewable tablet 80 mg  80 mg Oral PRN Fortino Sic, MD      . TDaP Leda Min) injection 0.5 mL  0.5 mL Intramuscular Once Fortino Sic, MD      . zolpidem [facility]) tablet 5 mg  5 mg Oral QHS PRN Fortino Sic, MD      . DISCONTD: acetaminophen (TYLENOL) tablet 650 mg  650 mg Oral Q4H PRN Pricilla Holm, MD      . DISCONTD: butorphanol (STADOL) injection 1 mg  1 mg Intravenous Q1H PRN Pricilla Holm, MD   1 mg at 08/14/11 1955  . DISCONTD: carboprost  (HEMABATE) 250 MCG/ML injection           . DISCONTD: citric acid-sodium citrate (ORACIT) solution 30 mL  30 mL Oral Q2H PRN Pricilla Holm, MD      . DISCONTD: diphenhydrAMINE (BENADRYL) injection 12.5 mg  12.5 mg Intravenous Q15 min PRN Jiles Garter, MD      . DISCONTD: ePHEDrine injection 10 mg  10 mg Intravenous PRN Jiles Garter, MD      . DISCONTD: ePHEDrine injection 10 mg  10 mg Intravenous PRN Jiles Garter, MD      . DISCONTD: fentaNYL 2.5 mcg/ml w/bupivacaine 0.1% epidural (60 ml syringe)  14 mL/hr Epidural Continuous Jiles Garter, MD   14 mL/hr at 08/15/11 0700  . DISCONTD: hydrALAZINE (APRESOLINE) injection 10 mg  10 mg Intravenous Q20 Min Fortino Sic, MD      . DISCONTD: ibuprofen (ADVIL,MOTRIN) tablet 600 mg  600 mg Oral Q6H PRN Pricilla Holm, MD      . DISCONTD: ibuprofen (ADVIL,MOTRIN) tablet 600 mg  600 mg Oral Q6H Fortino Sic, MD      . DISCONTD: lactated ringers infusion 500 mL  500 mL Intravenous Once Jiles Garter, MD      . DISCONTD: lactated ringers infusion 500-1,000 mL  500-1,000 mL Intravenous PRN Pricilla Holm, MD 1,000 mL/hr at 08/14/11 2146 500 mL at 08/14/11 2146  . DISCONTD: lactated ringers infusion   Intravenous Continuous Pricilla Holm, MD      . DISCONTD: lidocaine (XYLOCAINE) 1 % injection 30 mL  30 mL Subcutaneous PRN Pricilla Holm, MD      . DISCONTD: magnesium bolus via infusion 4 g  4 g Intravenous Once Pricilla Holm, MD      . DISCONTD: magnesium sulfate 40 grams in LR 500 mL infusion  1 g/hr Intravenous Titrated Fortino Sic, MD 12.5 mL/hr at 08/15/11 2200 1 g/hr at 08/15/11 2200  . DISCONTD: misoprostol (CYTOTEC) 200 MCG tablet           . DISCONTD: ondansetron (ZOFRAN) injection 4 mg  4 mg Intravenous Q6H PRN Pricilla Holm, MD   4 mg at 08/15/11 0038  . DISCONTD: oxyCODONE-acetaminophen (PERCOCET) 5-325 MG per tablet 2 tablet  2 tablet Oral Q3H PRN Pricilla Holm, MD      . DISCONTD: oxytocin (PITOCIN) 10  UNIT/ML injection           . DISCONTD: oxytocin (PITOCIN) IV BOLUS FROM BAG  500 mL Intravenous Once [employer],  MD      . DISCONTD: oxytocin (PITOCIN) IV infusion 20 units in LR 1000 mL  125 mL/hr Intravenous Once Pricilla Holm, MD      . DISCONTD: oxytocin (PITOCIN) IV infusion 20 units in LR 1000 mL  1-40 milli-units/min Intravenous Titrated Pricilla Holm, MD 113 mL/hr at 08/15/11 1517 37.667 milli-units/min at 08/15/11 1517  . DISCONTD: phenylephrine injection 80 mcg  80 mcg Intravenous PRN Jiles Garter, MD      . DISCONTD: phenylephrine injection 80 mcg  80 mcg Intravenous PRN Jiles Garter, MD      . DISCONTD: sodium phosphate (FLEET) 7-19 GM/118ML enema 1 enema  1 enema Rectal PRN Pricilla Holm, MD       Facility-Administered Medications Ordered in Other Encounters  Medication Dose Route Frequency Provider Last Rate Last Dose  . DISCONTD: lidocaine 1.5 % injection    PRN Velna Hatchet, MD   5 mL at 08/14/11 2234    Allergies as of 08/14/2011 - Review Complete 08/14/2011  Allergen Reaction Noted  . Ethanol  08/14/2011  . Rubbing alcohol (alcohol) Rash 07/07/2011    History reviewed. No pertinent family history.  History   Social History  . Marital Status: Single    Spouse Name: N/A    Number of Children: N/A  . Years of Education: N/A   Occupational History  . Not on file.   Social History Main Topics  . Smoking status: Never Smoker   . Smokeless tobacco: Never Used  . Alcohol Use: No  . Drug Use: No  . Sexually Active: Yes    Birth Control/ Protection: None   Other Topics Concern  . Not on file   Social History Narrative  . No narrative on file    Review of Systems: Gen: Denies any fever, chills, sweats, anorexia, fatigue, weakness, malaise, weight loss, and sleep disorder CV: Denies chest pain, angina, palpitations, syncope, orthopnea, PND, peripheral edema, and claudication. Resp: Denies dyspnea at rest, dyspnea with exercise, cough,  sputum, wheezing, coughing up blood, and pleurisy. GI:see hpi GU : Denies urinary burning, blood in urine, urinary frequency, urinary hesitancy, nocturnal urination, and urinary incontinence. MS: Denies joint pain, limitation of movement, and swelling, stiffness, low back pain, extremity pain.    Psych: Denies depression, anxiety, memory loss, suicidal ideation, hallucinations, paranoia, and confusion. Heme: Denies bruising, bleeding, and enlarged lymph nodes. Neuro:  Denies any headaches, dizziness, paresthesias.   Physical Exam: Vital signs in last 24 hours: Temp:  [97.9 F (36.6 C)-99 F (37.2 C)] 98.1 F (36.7 C) (11/10 0412) Pulse Rate:  [77-151] 79  (11/10 1040) Resp:  [16-30] 18  (11/10 1040) BP: (111-178)/(67-146) 148/87 mmHg (11/10 1028) SpO2:  [97 %-100 %] 100 % (11/10 1040) Last BM Date: 08/14/11 General:   Alert,  Well-developed, well-nourished, pleasant and cooperative in NAD Head:  Normocephalic and atraumatic. Eyes:  Sclera clear, no icterus.   Conjunctiva pink. Ears:  Normal auditory acuity. Nose:  No deformity, discharge,  or lesions. Mouth:  No deformity or lesions.  Oropharynx pink & moist. Neck:  Supple; no masses or thyromegaly. Lungs:  Clear throughout to auscultation.   No wheezes, crackles, or rhonchi. No acute distress. Heart:  Regular rate and rhythm; no murmurs, clicks, rubs,  or gallops. Abdomen:  Soft, nontender and nondistended. No masses, hepatosplenomegaly or hernias noted. Normal bowel sounds, without guarding, and without rebound.   Rectal: not done Msk:  Symmetrical without gross deformities. Normal posture. Pulses:  Normal pulses noted.  Extremities:  Without clubbing or edema. Neurologic:  Alert and  oriented x4;  grossly normal neurologically. .  Intake/Output from previous day: 11/09 0701 - 11/10 0700 In: 5438.6 [P.O.:2000; I.V.:3338.6; IV Piggyback:100] Out: 4485 [Urine:3485; Emesis/NG output:500; Blood:500] Intake/Output this  shift: Total I/O In: 980 [P.O.:480; I.V.:500] Out: 1725 [Urine:1725]  Lab Results:  Basename 08/16/11 0539 08/15/11 1713 08/15/11 1404  WBC 18.0* 26.5* 27.2*  HGB 9.4* 11.4* 11.9*  HCT 28.2* 34.7* 36.7  PLT 121* 149* 161   BMET  Basename 08/16/11 0539 08/15/11 1713 08/14/11 2015  NA 132* 133* 132*  K 3.9 4.0 3.9  CL 99 99 99  CO2 28 24 22   GLUCOSE 113* 155* 80  BUN 19 20 17   CREATININE 1.25* 1.46* 1.05  CALCIUM 6.5* 7.6* 10.2   LFT  Basename 08/16/11 0539  PROT 4.3*  ALBUMIN 1.7*  AST 46*  ALT 65*  ALKPHOS 228*  BILITOT 0.2*  BILIDIR --  IBILI --   PT/INR No results found for this basename: LABPROT:2,INR:2 in the last 72 hours        Impression: #8 40 year old female day #1 status post vaginal delivery yesterday 08/15/2011. Patient had complication of pregnancy-induced hypertension/eclampsia. She is improving today and plan is for her to be weaned off of magnesium and transferred to a regular bed.  #2 Coffee-ground emesis , related to above. Rule out Mallory-Weiss tear erosive esophagitis versus gastritis. Patient is stable without any evidence of active bleeding over the past 24-hour line  #3 anemia normocytic secondary to acute blood loss, this may be multifactorial in this setting of delivery yesterday.  Plan: #1 Discussed with the patient and her family and will schedule for upper endoscopy with Dr. Arlyce Dice at [facility] Sunday morning 08/17/2011. She will have moderate sedation. She will need to dump her breastmilk for the remainder of that day #2 start Protonix 40 mg by mouth daily, and would anticipate discharging her home on a PPI. Further plans pending findings of upper endoscopy.   Amy Esterwood  08/16/2011, 11:18 AM  Chart was reviewed and patient was examined. X-rays were reviewed.    I agree with management and plans. Imp - Acute, limited UGI bleed as evidenced by coffee ground emesis.  Suspect MW tear vs esophagitis/ gastritis.  Plan  as above.  Barbette Hair. Arlyce Dice, M.D., [facility]

## 2011-08-16 NOTE — Addendum Note (Signed)
Addendum  created 08/16/11 0742 by Edison Pace, CRNA   Modules edited:Charges VN, Notes Section

## 2011-08-16 NOTE — Progress Notes (Signed)
08/16/11 1155 magnesium & foley d/c'd. 1640 SBar report called to Mcarthur Rossetti, RN for pt transfer to [facility] rm #[id number].

## 2011-08-16 NOTE — Progress Notes (Signed)
Delivery Note At 11:39 AM a viable female was delivered via Vaginal, Outlet Forceps (Presentation: ; Occiput Anterior).  APGAR: 9, 9; weight 5 lb 13.3 oz (2645 g).   Placenta status: , .  Cord: 3 vessels with the following complications: .  Anesthesia: Epidural  Episiotomy: Median, 2nd degree Lacerations: none Suture Repair: 2-0 chromic Est. Blood Loss (mL): 500  Mom to AICU for magnesium sulfate prophylaxis and close observation for severe PIH.  Baby to nursery-stable.  Helen Russell 08/16/2011, 1:41 PM

## 2011-08-16 NOTE — Progress Notes (Signed)

## 2011-08-17 ENCOUNTER — Encounter (HOSPITAL_COMMUNITY)
Admission: AD | Disposition: A | Discharge status: Home / Self Care | Payer: Self-pay | Source: Ambulatory Visit | Attending: Obstetrics and Gynecology

## 2011-08-17 ENCOUNTER — Encounter (HOSPITAL_COMMUNITY): Payer: Self-pay | Admitting: *Deleted

## 2011-08-17 DIAGNOSIS — K209 Esophagitis, unspecified: Secondary | ICD-10-CM

## 2011-08-17 HISTORY — PX: ESOPHAGOGASTRODUODENOSCOPY: SHX5428

## 2011-08-17 LAB — COMPREHENSIVE METABOLIC PANEL
ALT: 44 U/L — ABNORMAL HIGH (ref 0–35)
AST: 35 U/L (ref 0–37)
Calcium: 7.6 mg/dL — ABNORMAL LOW (ref 8.4–10.5)
Creatinine, Ser: 0.96 mg/dL (ref 0.50–1.10)
GFR calc Af Amer: 85 mL/min — ABNORMAL LOW (ref >90)
GFR calc non Af Amer: 74 mL/min — ABNORMAL LOW (ref >90)
Glucose, Bld: 70 mg/dL (ref 70–99)
Sodium: 132 mEq/L — ABNORMAL LOW (ref 135–145)
Total Protein: 4.8 g/dL — ABNORMAL LOW (ref 6.0–8.3)

## 2011-08-17 LAB — CBC
MCH: 30.1 pg (ref 26.0–34.0)
MCHC: 32.2 g/dL (ref 30.0–36.0)
MCV: 93.2 fL (ref 78.0–100.0)
Platelets: 133 10*3/uL — ABNORMAL LOW (ref 150–400)

## 2011-08-17 SURGERY — EGD (ESOPHAGOGASTRODUODENOSCOPY)
Anesthesia: Moderate Sedation

## 2011-08-17 MED ORDER — FERROUS SULFATE 325 (65 FE) MG PO TABS
325.0000 mg | ORAL_TABLET | Freq: Two times a day (BID) | ORAL | Status: DC
  Administered 2011-08-17 – 2011-08-18 (×2): 325 mg via ORAL
  Filled 2011-08-17 (×2): qty 1

## 2011-08-17 MED ORDER — MIDAZOLAM HCL 5 MG/5ML IJ SOLN
INTRAMUSCULAR | Status: DC | PRN
  Administered 2011-08-17 (×2): 2 mg via INTRAVENOUS

## 2011-08-17 MED ORDER — FENTANYL NICU IV SYRINGE 50 MCG/ML
INJECTION | INTRAMUSCULAR | Status: DC | PRN
  Administered 2011-08-17 (×2): 25 ug via INTRAVENOUS

## 2011-08-17 MED ORDER — BISACODYL 10 MG RE SUPP
10.0000 mg | Freq: Once | RECTAL | Status: AC
  Administered 2011-08-17: 10 mg via RECTAL
  Filled 2011-08-17: qty 1

## 2011-08-17 MED ORDER — DIPHENHYDRAMINE HCL 50 MG/ML IJ SOLN
INTRAMUSCULAR | Status: AC
  Filled 2011-08-17: qty 1

## 2011-08-17 MED ORDER — FENTANYL CITRATE 0.05 MG/ML IJ SOLN
INTRAMUSCULAR | Status: AC
  Filled 2011-08-17: qty 4

## 2011-08-17 MED ORDER — BUTAMBEN-TETRACAINE-BENZOCAINE 2-2-14 % EX AERO
INHALATION_SPRAY | CUTANEOUS | Status: DC | PRN
  Administered 2011-08-17: 2 via TOPICAL

## 2011-08-17 MED ORDER — MIDAZOLAM HCL 10 MG/2ML IJ SOLN
INTRAMUSCULAR | Status: AC
  Filled 2011-08-17: qty 4

## 2011-08-17 MED ORDER — GLYCOPYRROLATE 0.2 MG/ML IJ SOLN
INTRAMUSCULAR | Status: AC
  Filled 2011-08-17: qty 1

## 2011-08-17 MED ORDER — CEPHALEXIN 500 MG PO CAPS
500.0000 mg | ORAL_CAPSULE | Freq: Three times a day (TID) | ORAL | Status: DC
  Administered 2011-08-17 – 2011-08-18 (×3): 500 mg via ORAL
  Filled 2011-08-17 (×7): qty 1

## 2011-08-17 NOTE — Progress Notes (Signed)
Post Partum Day 2 Subjective: no complaints Pt is s/p upper endoscopy this morning.  Findings: erosive esophagitis  Objective: Blood pressure 152/92, pulse 70, temperature 98.2 F (36.8 C), temperature source Oral, resp. rate 16, height 5\' 6"  (1.676 m), weight 145 lb (65.772 kg), SpO2 99.00%, unknown if currently breastfeeding.  Physical Exam:  General: alert, cooperative and no distress Lochia: appropriate Uterine Fundus: firm Episiotomy, laceration : no significant drainage DVT Evaluation: No evidence of DVT seen on physical exam.   Basename 08/17/11 0515 08/16/11 0539  HGB 8.9* 9.4*  HCT 27.6* 28.2*  AST/ALT 35/44 WBC 16,000    plt 133,000  Creatinine 0.89  Calcium wnl  Assessment/Plan:  1.  PIH continues to improve as BPs are normal without meds.  Also Liver enzymes and creatinine are correcting .  2.  WBC elevations are improving.  No temperature elevation.  Will switch to po antibiotic. Suspect source of elevation is erosive esophagitis. 3.  S/P endoscopy, stable.  May discharge later today or in the morning.   LOS: 3 days   GREENE,ELEANOR E 08/17/2011, 12:19 PM

## 2011-08-17 NOTE — H&P (View-Only) (Signed)
Called to AICU for patient with high blood pressure and vomiting upon transfer from L&D to AICU.  Patient had SVD with epidural earlier today and has preeclampsia on magnesium drip.  She arrived in AICU vomiting, shivering and with decreased LOC.  On my arrival BP was 198/118 and 161/119 with HR 113, patient was sedated but arousable and oriented.  I ordered a dose of labetalol IV 10 mg. BP lowered to 142/82 and HR to 88.  Patient more alert though still sedated.  No difficulty breathing, o2 sat 98% on RA.  Dr Greene has been called.  Care over to Dr. Greene and AICU staff.  A. Arista Kettlewell, MD 

## 2011-08-17 NOTE — Progress Notes (Deleted)
I spoke with Ovidio Hanger (pharmd) at [facility] as pt had questions concerning the use of Fentanyl and Versed that will be used for sedation in EGD. Per Dewayne Hatch with the max quanity of Fentanyl and 10mg  of Versed, as a precaution pt should not pump or breast feed within 4 hrs of receiving these drugs, After the 4 hrs pt can breast feed infant without discarding her milk. Explained this with pt. And she verbalized understanding. s tedder Theola Sequin

## 2011-08-17 NOTE — Interval H&P Note (Signed)
History and Physical Interval Note:   08/17/2011   9:05 AM   Helen Russell  has presented today for surgery, with the diagnosis of hematemesis  The various methods of treatment have been discussed with the patient and family. After consideration of risks, benefits and other options for treatment, the patient has consented to  Procedure(s): ESOPHAGOGASTRODUODENOSCOPY (EGD) as a surgical intervention .  The patients' history has been reviewed, patient examined, no change in status, stable for surgery.  I have reviewed the patients' chart and labs.  Questions were answered to the patient's satisfaction.     Melvia Heaps  MD

## 2011-08-17 NOTE — Progress Notes (Signed)
Upper endoscopy demonstrated grade B. erosions in the distal esophagus.  This is very likely the source for her GI bleeding.  Recommendations 1) 4-6 week course of PPI therapy

## 2011-08-17 NOTE — Progress Notes (Signed)
I spoke with Helen Russell (pharmd) at [facility] as pt had questions concerning the use of Fentanyl and Versed that will be used for sedation in EGD. Per Helen with the max quanity of 100mcg Fentanyl and 10mg of Versed, as a precaution pt should not pump or breast feed within 4 hrs of receiving these drugs, After the 4 hrs pt can breast feed infant without discarding her milk. Explained this with pt. And she verbalized understanding. s Sabre Leonetti rn 

## 2011-08-18 LAB — CBC
HCT: 32.2 % — ABNORMAL LOW (ref 36.0–46.0)
MCH: 29.8 pg (ref 26.0–34.0)
MCV: 93.1 fL (ref 78.0–100.0)
RDW: 14.4 % (ref 11.5–15.5)
WBC: 14.7 10*3/uL — ABNORMAL HIGH (ref 4.0–10.5)

## 2011-08-18 LAB — DIFFERENTIAL
Basophils Absolute: 0 10*3/uL (ref 0.0–0.1)
Eosinophils Relative: 2 % (ref 0–5)
Lymphocytes Relative: 19 % (ref 12–46)
Monocytes Absolute: 0.7 10*3/uL (ref 0.1–1.0)

## 2011-08-18 LAB — COMPREHENSIVE METABOLIC PANEL
AST: 75 U/L — ABNORMAL HIGH (ref 0–37)
CO2: 26 mEq/L (ref 19–32)
Calcium: 9.3 mg/dL (ref 8.4–10.5)
Creatinine, Ser: 0.79 mg/dL (ref 0.50–1.10)
GFR calc Af Amer: >90 mL/min (ref >90)
GFR calc non Af Amer: >90 mL/min (ref >90)
Glucose, Bld: 73 mg/dL (ref 70–99)

## 2011-08-18 NOTE — Discharge Summary (Signed)
Obstetric Discharge Summary Reason for Admission: onset of labor Prenatal Procedures: none Intrapartum Procedures: spontaneous vaginal delivery Postpartum Procedures: upper endoscopy Complications-Operative and Postpartum: hypertensive and syncopal episode resolved with labetolol Hemoglobin  Date Value Range Status  08/18/2011 10.3* 12.0-15.0 (g/dL) Final     HCT  Date Value Range Status  08/18/2011 32.2* 36.0-46.0 (%) Final    Discharge Diagnoses: Term Pregnancy-delivered                                          Severe Preeclampsia                                          Erosive esophagitis  Discharge Information: Date: 08/18/2011 Activity: pelvic rest Diet: routine Medications: Ibuprofen, Iron and aldomet and protonix Condition: stable Instructions: refer to practice specific booklet Discharge to: home   Newborn Data: Live born female  Birth Weight: 5 lb 13.3 oz (2645 g) APGAR: 9, 9  Home with mother.  Fortino Sic 08/18/2011, 12:25 PM

## 2011-08-18 NOTE — Progress Notes (Signed)
Called [facility] regarding follow up for Ms. Helen Russell. She had a Esophagogastroduodenoscopy on 08/17/11.   I was given Amy Esterwoods number. She returned my call stating patient just need to take OTC Prilosec for 4 to 6 weeks.  No further follow was needed with Dr. Arlyce Dice. Will relay to patient on discharge instructions.

## 2011-08-18 NOTE — Progress Notes (Signed)
Post Partum Day 3 Subjective: no complaints, voiding and tolerating PO  Objective: Blood pressure 152/94, pulse 71, temperature 98.5 F (36.9 C), temperature source Oral, resp. rate 20, height 5\' 6"  (1.676 m), weight 145 lb (65.772 kg), SpO2 99.00%, unknown if currently breastfeeding.  Physical Exam:  General: alert and no distress Lochia: appropriate Uterine Fundus: firm Episiotomy, laceration : no significant drainage DVT Evaluation: No evidence of DVT seen on physical exam.   Basename 08/18/11 0825 08/17/11 0515  HGB 10.3* 8.9*  HCT 32.2* 27.6*    Assessment/Plan: Discharge home   LOS: 4 days   Helen Russell E 08/18/2011, 12:31 PM

## 2011-08-29 ENCOUNTER — Encounter (HOSPITAL_COMMUNITY): Payer: Self-pay | Admitting: Gastroenterology

## 2012-01-27 ENCOUNTER — Other Ambulatory Visit (HOSPITAL_COMMUNITY)
Admission: RE | Admit: 2012-01-27 | Discharge: 2012-01-27 | Disposition: A | Discharge status: Home / Self Care | Payer: PRIVATE HEALTH INSURANCE | Source: Ambulatory Visit | Attending: Obstetrics and Gynecology | Admitting: Obstetrics and Gynecology

## 2012-01-27 DIAGNOSIS — Z124 Encounter for screening for malignant neoplasm of cervix: Secondary | ICD-10-CM | POA: Insufficient documentation

## 2012-01-27 DIAGNOSIS — Z1159 Encounter for screening for other viral diseases: Secondary | ICD-10-CM | POA: Insufficient documentation

## 2012-02-16 ENCOUNTER — Ambulatory Visit: Payer: PRIVATE HEALTH INSURANCE | Admitting: Family

## 2012-09-08 ENCOUNTER — Ambulatory Visit: Payer: PRIVATE HEALTH INSURANCE | Admitting: Family

## 2013-04-27 ENCOUNTER — Other Ambulatory Visit: Payer: Self-pay | Admitting: Obstetrics and Gynecology

## 2013-04-27 DIAGNOSIS — Z1231 Encounter for screening mammogram for malignant neoplasm of breast: Secondary | ICD-10-CM

## 2013-05-16 ENCOUNTER — Ambulatory Visit
Admission: RE | Admit: 2013-05-16 | Discharge: 2013-05-16 | Disposition: A | Discharge status: Home / Self Care | Payer: PRIVATE HEALTH INSURANCE | Source: Ambulatory Visit | Attending: Obstetrics and Gynecology | Admitting: Obstetrics and Gynecology

## 2013-05-16 DIAGNOSIS — Z1231 Encounter for screening mammogram for malignant neoplasm of breast: Secondary | ICD-10-CM

## 2014-04-20 ENCOUNTER — Other Ambulatory Visit: Payer: Self-pay

## 2014-04-20 DIAGNOSIS — Z1231 Encounter for screening mammogram for malignant neoplasm of breast: Secondary | ICD-10-CM

## 2014-05-18 ENCOUNTER — Ambulatory Visit
Admission: RE | Admit: 2014-05-18 | Discharge: 2014-05-18 | Disposition: A | Discharge status: Home / Self Care | Payer: Managed Care, Other (non HMO) | Source: Ambulatory Visit

## 2014-05-18 ENCOUNTER — Encounter (INDEPENDENT_AMBULATORY_CARE_PROVIDER_SITE_OTHER): Payer: Self-pay

## 2014-05-18 DIAGNOSIS — Z1231 Encounter for screening mammogram for malignant neoplasm of breast: Secondary | ICD-10-CM

## 2014-08-07 ENCOUNTER — Other Ambulatory Visit: Payer: Self-pay | Admitting: Physician Assistant

## 2014-08-07 ENCOUNTER — Encounter (HOSPITAL_COMMUNITY): Payer: Self-pay | Admitting: Gastroenterology

## 2014-08-07 ENCOUNTER — Other Ambulatory Visit (HOSPITAL_COMMUNITY)
Admission: RE | Admit: 2014-08-07 | Discharge: 2014-08-07 | Disposition: A | Discharge status: Home / Self Care | Payer: Managed Care, Other (non HMO) | Source: Ambulatory Visit | Attending: Family Medicine | Admitting: Family Medicine

## 2014-08-07 DIAGNOSIS — Z124 Encounter for screening for malignant neoplasm of cervix: Secondary | ICD-10-CM | POA: Diagnosis not present

## 2014-08-09 LAB — CYTOLOGY - PAP

## 2015-03-19 ENCOUNTER — Encounter: Payer: Self-pay | Admitting: Neurology

## 2015-03-19 ENCOUNTER — Ambulatory Visit (INDEPENDENT_AMBULATORY_CARE_PROVIDER_SITE_OTHER): Payer: Managed Care, Other (non HMO) | Admitting: Neurology

## 2015-03-19 VITALS — BP 148/101 | HR 81 | Temp 98.1°F | Ht 66.75 in | Wt 129.0 lb

## 2015-03-19 DIAGNOSIS — H538 Other visual disturbances: Secondary | ICD-10-CM

## 2015-03-19 DIAGNOSIS — R519 Headache, unspecified: Secondary | ICD-10-CM | POA: Insufficient documentation

## 2015-03-19 DIAGNOSIS — G4489 Other headache syndrome: Secondary | ICD-10-CM | POA: Diagnosis not present

## 2015-03-19 DIAGNOSIS — R51 Headache: Secondary | ICD-10-CM

## 2015-03-19 DIAGNOSIS — R42 Dizziness and giddiness: Secondary | ICD-10-CM | POA: Diagnosis not present

## 2015-03-19 MED ORDER — SUMATRIPTAN-NAPROXEN SODIUM 85-500 MG PO TABS: 1.0000 | ORAL_TABLET | ORAL | Status: AC | PRN

## 2015-03-19 MED ORDER — METHYLPREDNISOLONE 4 MG PO TBPK: ORAL_TABLET | ORAL | Status: DC

## 2015-03-19 NOTE — Progress Notes (Signed)
[facility]    Provider:  Dr Lucia Gaskins Referring Provider: Maurice Small, MD Primary Care Physician:  REDMON,NOELLE, PA-C  CC:  headache  HPI:  Helen Russell is a 44 y.o. female here as a referral from Dr. Valentina Lucks for headache. She has pain behind her left eye. Started a month ago after her prescription for her eyes changed. She started getting dizzy and the whole room shifted when she got up. She is having daily episodes of room spinning. When she rolls over to the right, she can feel her symptoms. Feels like she is on a boat. She has an uncomfortable feeling on the left side of the head, she feels off balance, she has neck tightness and soreness and heaviness in her neck. The headache comes and goes daily. Sometimes it feels like her head is going to pop. No photophobia, no phonophobia. More pressure in the eye. The dizziness is moreso when changing positions. Like when she gets up very fast. Vertigo is brief. No nausea, no hearing changes, +blurry and vision changes. She feels like she is still on the airplane, off balance. No inciting events other than change in vision as stated above. No family members with migraines. No weakness. No paresthesias. The meclizine didn't help. No other focal neurologic symptoms. She is under stress at work and is not sleeping well.    Reviewed notes, labs and imaging from outside physicians, which showed: tsh wnl,   Review of Systems: Patient complains of symptoms per HPI as well as the following symptoms: headache, dizziness, not enough sleep Pertinent negatives per HPI. All others negative.   History   Social History  . Marital Status: Married    Spouse Name: N/A  . Number of Children: 1  . Years of Education: 12   Occupational History  . [employer]     Social History Main Topics  . Smoking status: Never Smoker   . Smokeless tobacco: Never Used  . Alcohol Use: No  . Drug Use: No  . Sexual Activity: Yes    Birth Control/  Protection: None   Other Topics Concern  . Not on file   Social History Narrative   Lives at home with spouse.   Caffeine use: 1 or 2 per week (coffee)    Family History  Problem Relation Age of Onset  . Diabetes Mother   . High blood pressure Mother   . Diverticulitis Mother     Past Medical History  Diagnosis Date  . No pertinent past medical history   . Abnormal Pap smear     LEEP 1996    Past Surgical History  Procedure Laterality Date  . Leep      1996  . No past surgeries    . Esophagogastroduodenoscopy  08/17/2011    Procedure: ESOPHAGOGASTRODUODENOSCOPY (EGD);  Surgeon: Louis Meckel, MD;  Location: [facility];  Service: Endoscopy;  Laterality: N/A;    Current Outpatient Prescriptions  Medication Sig Dispense Refill  . Meclizine HCl (BONINE PO) Take 25 mg by mouth as needed.     . methylPREDNISolone (MEDROL DOSEPAK) 4 MG TBPK tablet As directed 21 tablet 0  . SUMAtriptan-naproxen (TREXIMET) 85-500 MG per tablet Take 1 tablet by mouth every 2 (two) hours as needed for migraine. 2 tablet 0   No current facility-administered medications for this visit.    Allergies as of 03/19/2015 - Review Complete 03/19/2015  Allergen Reaction Noted  . Ethanol  08/14/2011  . Rubbing alcohol [alcohol] Rash 07/07/2011  Vitals: BP 148/101 mmHg  Pulse 81  Temp(Src) 98.1 F (36.7 C)  Ht 5' 6.75" (1.695 m)  Wt 129 lb (58.514 kg)  BMI 20.37 kg/m2 Last Weight:  Wt Readings from Last 1 Encounters:  03/19/15 129 lb (58.514 kg)   Last Height:   Ht Readings from Last 1 Encounters:  03/19/15 5' 6.75" (1.695 m)    Physical exam: Exam: Gen: NAD, conversant, well nourised, well groomed                     CV: RRR, no MRG. No Carotid Bruits. No peripheral edema, warm, nontender Eyes: Conjunctivae clear without exudates or hemorrhage  Neuro: Detailed Neurologic Exam  Speech:    Speech is normal; fluent and spontaneous with normal comprehension.  Cognition:     The patient is oriented to person, place, and time;     recent and remote memory intact;     language fluent;     normal attention, concentration,     fund of knowledge Cranial Nerves:    The pupils are equal, round, and reactive to light. The fundi are normal and spontaneous venous pulsations are present. Visual fields are full to finger confrontation. Extraocular movements are intact. Trigeminal sensation is intact and the muscles of mastication are normal. The face is symmetric. The palate elevates in the midline. Hearing intact. Voice is normal. Shoulder shrug is normal. The tongue has normal motion without fasciculations.   Coordination:    Normal finger to nose and heel to shin. Normal rapid alternating movements.   Gait:    Heel-toe and tandem gait are impaired due to balance.  Motor Observation:    No asymmetry, no atrophy, and no involuntary movements noted. Tone:    Normal muscle tone.    Posture:    Posture is normal. normal erect    Strength:    Strength is V/V in the upper and lower limbs.      Sensation: intact to LT     Reflex Exam:  DTR's:    Deep tendon reflexes in the upper and lower extremities are normal bilaterally.   Toes:    The toes are downgoing bilaterally.   Clonus:    Clonus is absent.      Assessment/Plan:  44 year old female with recent onset left-sided headache with dizziness and vertigo in the setting of stress at work and blurry vision.She is having episodes of blurred vision.  Need to rule out central cause of vertigo such as pathologies affecting the brainstem or vestibular nerve, vascular event involving the cerebellum, schwannoma. Order MRI of the brain. Can consider vestibular migraine however that is a diagnosis of exclusion.     Discussed trying other medications used to treat headache/migraine.  I believe there is an anxiety component as well. A small study of patients with vestibular migraines showing that rizatriptan helped with  motion sickness however discussed that she can only take this medication 9x a month. Will try a steroid taper and if symptoms do not improve can consider nortriptyline or topamax.  Helen Dean, MD  [facility] [address] [place], [place] [address]  Phone [phone] Fax [phone]

## 2015-03-19 NOTE — Patient Instructions (Addendum)
Overall you are doing fairly well but I do want to suggest a few things today:   Remember to drink plenty of fluid, eat healthy meals and do not skip any meals. Try to eat protein with a every meal and eat a healthy snack such as fruit or nuts in between meals. Try to keep a regular sleep-wake schedule and try to exercise daily, particularly in the form of walking, 20-30 minutes a day, if you can.   As far as your medications are concerned, I would like to suggest: Steroid taper Try Treximet can repeat in 2 hours  As far as diagnostic testing: MRi of the brain  I would like to see you back in 10 days, sooner if we need to. Please call us with any interim questions, concerns, problems, updates or refill requests.   Please also call us for any test results so we can go over those with you on the phone.  My clinical assistant and will answer any of your questions and relay your messages to me and also relay most of my messages to you.   Our phone number is [phone]. We also have an after hours call service for urgent matters and there is a physician on-call for urgent questions. For any emergencies you know to call 911 or go to the nearest emergency room

## 2015-03-29 ENCOUNTER — Encounter: Payer: Self-pay | Admitting: Neurology

## 2015-03-29 ENCOUNTER — Ambulatory Visit (INDEPENDENT_AMBULATORY_CARE_PROVIDER_SITE_OTHER): Payer: Managed Care, Other (non HMO) | Admitting: Neurology

## 2015-03-29 VITALS — BP 125/82 | HR 62 | Ht 66.75 in | Wt 130.6 lb

## 2015-03-29 DIAGNOSIS — R42 Dizziness and giddiness: Secondary | ICD-10-CM | POA: Diagnosis not present

## 2015-03-29 MED ORDER — ALPRAZOLAM 0.25 MG PO TABS: 0.2500 mg | ORAL_TABLET | Freq: Three times a day (TID) | ORAL | Status: AC | PRN

## 2015-03-29 NOTE — Progress Notes (Signed)
[facility]    Provider: Dr Lucia Gaskins Referring Provider: Maurice Small, MD Primary Care Physician: REDMON,NOELLE, PA-C  CC: headache  Interval update 03/29/2015: Patient's headache is a little better but is still present  and the dizziness persists. Very difficult for her to do her job, feels off balance. She has neck pain as well. She still has left eye pain. Daily episodes of dizziness and vertigo with room spinning. She feels off balance like she is going to fall. +blurry vision.   Initial visit 03/19/2015: Helen Russell is a 44 y.o. female here as a referral from Dr. Valentina Lucks for headache. She has pain behind her left eye. Started a month ago after her prescription for her eyes changed. She started getting dizzy and the whole room shifted when she got up. She is having daily episodes of room spinning. When she rolls over to the right, she can feel her symptoms. Feels like she is on a boat. She has an uncomfortable feeling on the left side of the head, she feels off balance, she has neck tightness and soreness and heaviness in her neck. The headache comes and goes daily. Sometimes it feels like her head is going to pop. No photophobia, no phonophobia. More pressure in the eye. The dizziness is moreso when changing positions. Like when she gets up very fast. Vertigo is brief. No nausea, no hearing changes, +blurry and vision changes. She feels like she is still on the airplane, off balance. No inciting events other than change in vision as stated above. No family members with migraines. No weakness. No paresthesias. The meclizine didn't help. No other focal neurologic symptoms. She is under stress at work and is not sleeping well.    Reviewed notes, labs and imaging from outside physicians, which showed: tsh wnl,   Review of Systems: Patient complains of symptoms per HPI as well as the following symptoms: headache, dizziness, not enough sleep Pertinent negatives per HPI.  All others negative.  History   Social History  . Marital Status: Married    Spouse Name: N/A  . Number of Children: 1  . Years of Education: 12   Occupational History  . [employer]     Social History Main Topics  . Smoking status: Never Smoker   . Smokeless tobacco: Never Used  . Alcohol Use: No  . Drug Use: No  . Sexual Activity: Yes    Birth Control/ Protection: None   Other Topics Concern  . Not on file   Social History Narrative   Lives at home with spouse.   Caffeine use: 1 or 2 per week (coffee)    Family History  Problem Relation Age of Onset  . Diabetes Mother   . High blood pressure Mother   . Diverticulitis Mother   . Migraines Neg Hx     Past Medical History  Diagnosis Date  . No pertinent past medical history   . Abnormal Pap smear     LEEP 1996    Past Surgical History  Procedure Laterality Date  . Leep      1996  . No past surgeries    . Esophagogastroduodenoscopy  08/17/2011    Procedure: ESOPHAGOGASTRODUODENOSCOPY (EGD);  Surgeon: Louis Meckel, MD;  Location: [facility];  Service: Endoscopy;  Laterality: N/A;    Current Outpatient Prescriptions  Medication Sig Dispense Refill  . Meclizine HCl (BONINE PO) Take 25 mg by mouth as needed.     . SUMAtriptan-naproxen (TREXIMET) 85-500 MG per tablet Take  1 tablet by mouth every 2 (two) hours as needed for migraine. 2 tablet 0  . ALPRAZolam (XANAX) 0.25 MG tablet Take 1 tablet (0.25 mg total) by mouth 3 (three) times daily as needed for anxiety. 60 tablet 1  . methylPREDNISolone (MEDROL DOSEPAK) 4 MG TBPK tablet As directed (Patient not taking: Reported on 03/29/2015) 21 tablet 0   No current facility-administered medications for this visit.    Allergies as of 03/29/2015 - Review Complete 03/29/2015  Allergen Reaction Noted  . Ethanol  08/14/2011  . Rubbing alcohol [alcohol] Rash 07/07/2011    Vitals: BP 125/82 mmHg  Pulse 62  Ht 5' 6.75" (1.695 m)  Wt 130 lb 9.6 oz (59.24 kg)  BMI  20.62 kg/m2 Last Weight:  Wt Readings from Last 1 Encounters:  03/29/15 130 lb 9.6 oz (59.24 kg)   Last Height:   Ht Readings from Last 1 Encounters:  03/29/15 5' 6.75" (1.695 m)   Physical exam: Exam: Gen: laying on bed with eyes closed, appears in distress, well nourised, obese, well groomed         CV: RRR, no MRG. No Carotid Bruits. No peripheral edema, warm, nontender Eyes: Conjunctivae clear without exudates or hemorrhage  Neuro: Detailed Neurologic Exam  Speech:    Speech is normal; fluent and spontaneous with normal comprehension.  Cognition:    The patient is oriented to person, place, and time;     recent and remote memory intact;     language fluent;     normal attention, concentration,     fund of knowledge Cranial Nerves:    The pupils are equal, round, and reactive to light. The fundi are flat. Visual fields are full to finger confrontation. Extraocular movements are intact. Trigeminal sensation is intact and the muscles of mastication are normal. The face is symmetric. The palate elevates in the midline. Hearing intact. Voice is normal. Shoulder shrug is normal. The tongue has normal motion without fasciculations.   Coordination:    Normal finger to nose and heel to shin. Normal rapid alternating movements.   Gait:    ataxic    Strength:    Strength is V/V in the upper and lower limbs.        Assessment/Plan: 44 year old female with recent onset left-sided headache with dizziness and vertigo and blurry vision.She is having episodes of blurred vision. Need to rule out central cause of vertigo such as pathologies affecting the brainstem or vestibular nerve, vascular event involving the cerebellum, schwannoma. MRI of the brain ordered. Can consider vestibular migraine however that is a diagnosis of exclusion.     Again discussed trying other medications used to treat headache/migraine. A small study of patients with vestibular migraines showing that  rizatriptan helped with motion sickness however discussed that she can only take this medication 9x a month which won't help with daily episodes. Will start Xanax 0.25mg  which is a good vestibular suppressant temporarily. If no relief in 6 weeks need to start other medications. Also feel that vestibular therapy would be extremely helpful. Advised patient to consider taking some time off from work for vestibular therapy, will offer FMLA.  Naomie Dean, MD  [facility] [address] [place], [place] [address]  Phone [phone] Fax [phone]  A total of 30 minutes was spent face-to-face with this patient. Over half this time was spent on counseling patient on the vertigo and dizziness diagnosis and different diagnostic and therapeutic options available.

## 2015-06-28 ENCOUNTER — Telehealth: Payer: Self-pay

## 2015-06-28 NOTE — Telephone Encounter (Signed)
Per Dr. Lucia Gaskins, requested that patient be moved to [facility] schedule.   I spoke to patient and she was agreeable to see Darrol Angel on Monday at the same time.

## 2015-07-02 ENCOUNTER — Ambulatory Visit (INDEPENDENT_AMBULATORY_CARE_PROVIDER_SITE_OTHER): Payer: Managed Care, Other (non HMO) | Admitting: Nurse Practitioner

## 2015-07-02 ENCOUNTER — Ambulatory Visit: Payer: Self-pay | Admitting: Neurology

## 2015-07-02 ENCOUNTER — Encounter: Payer: Self-pay | Admitting: Nurse Practitioner

## 2015-07-02 VITALS — BP 129/80 | HR 71 | Ht 66.75 in | Wt 127.8 lb

## 2015-07-02 DIAGNOSIS — R42 Dizziness and giddiness: Secondary | ICD-10-CM

## 2015-07-02 DIAGNOSIS — G4489 Other headache syndrome: Secondary | ICD-10-CM | POA: Diagnosis not present

## 2015-07-02 DIAGNOSIS — H8113 Benign paroxysmal vertigo, bilateral: Secondary | ICD-10-CM | POA: Diagnosis not present

## 2015-07-02 NOTE — Progress Notes (Addendum)
[facility]  PATIENT: Helen Russell DOB: 05-22-1971   REASON FOR VISIT: vertigo, headaches HISTORY FROM:patient    HISTORY OF PRESENT ILLNESS:UPDATE 07/02/15 Helen Russell, 44 year old female returns for follow-up. She reports that her headache is a little better but is still present and the dizziness persists.  She has continued to work full-time, she works 7 days week.Very difficult for her to do her job, feels off balance. She has neck pain as well. Daily episodes of dizziness and vertigo with room spinning. She feels off balance like she is going to fall. Denies any visual symptoms. She has not obtained MRI of the brain. She does not particularly want to take medications long-term. She has not even tried Xanax to take when necessary. She returns for reevaluation   Initial visit 03/19/2015: Helen Russell is a 44 y.o. female here as a referral from Dr. Valentina Lucks for headache. She has pain behind her left eye. Started a month ago after her prescription for her eyes changed. She started getting dizzy and the whole room shifted when she got up. She is having daily episodes of room spinning. When she rolls over to the right, she can feel her symptoms. Feels like she is on a boat. She has an uncomfortable feeling on the left side of the head, she feels off balance, she has neck tightness and soreness and heaviness in her neck. The headache comes and goes daily. Sometimes it feels like her head is going to pop. No photophobia, no phonophobia. More pressure in the eye. The dizziness is moreso when changing positions. Like when she gets up very fast. Vertigo is brief. No nausea, no hearing changes, +blurry and vision changes. She feels like she is still on the airplane, off balance. No inciting events other than change in vision as stated above. No family members with migraines. No weakness. No paresthesias. The meclizine didn't help. No other focal neurologic symptoms. She is under  stress at work and is not sleeping well.    REVIEW OF SYSTEMS: Full 14 system review of systems performed and notable only for those listed, all others are neg:  Constitutional: neg  Cardiovascular: neg Ear/Nose/Throat: neg  Skin: neg Eyes: neg Respiratory: cough Gastroitestinal: neg  Hematology/Lymphatic: neg  Endocrine: neg Musculoskeletal:neck pain Allergy/Immunology: neg Neurological: dizziness headache Psychiatric: neg Sleep : neg   ALLERGIES: Allergies  Allergen Reactions  . Ethanol     Can not ingest products with ethanol/alcohol in them  . Rubbing Alcohol [Alcohol] Rash    HOME MEDICATIONS: Outpatient Prescriptions Prior to Visit  Medication Sig Dispense Refill  . ALPRAZolam (XANAX) 0.25 MG tablet Take 1 tablet (0.25 mg total) by mouth 3 (three) times daily as needed for anxiety. (Patient not taking: Reported on 07/02/2015) 60 tablet 1  . Meclizine HCl (BONINE PO) Take 25 mg by mouth as needed.     . SUMAtriptan-naproxen (TREXIMET) 85-500 MG per tablet Take 1 tablet by mouth every 2 (two) hours as needed for migraine. (Patient not taking: Reported on 07/02/2015) 2 tablet 0  . methylPREDNISolone (MEDROL DOSEPAK) 4 MG TBPK tablet As directed (Patient not taking: Reported on 07/02/2015) 21 tablet 0   No facility-administered medications prior to visit.    PAST MEDICAL HISTORY: Past Medical History  Diagnosis Date  . No pertinent past medical history   . Abnormal Pap smear     LEEP 1996    PAST SURGICAL HISTORY: Past Surgical History  Procedure Laterality Date  . Leep  1996  . No past surgeries    . Esophagogastroduodenoscopy  08/17/2011    Procedure: ESOPHAGOGASTRODUODENOSCOPY (EGD);  Surgeon: Louis Meckel, MD;  Location: [facility];  Service: Endoscopy;  Laterality: N/A;    FAMILY HISTORY: Family History  Problem Relation Age of Onset  . Diabetes Mother   . High blood pressure Mother   . Diverticulitis Mother   . Migraines Neg Hx      SOCIAL HISTORY: Social History   Social History  . Marital Status: Married    Spouse Name: N/A  . Number of Children: 1  . Years of Education: 12   Occupational History  . [employer]     Social History Main Topics  . Smoking status: Never Smoker   . Smokeless tobacco: Never Used  . Alcohol Use: No  . Drug Use: No  . Sexual Activity: Yes    Birth Control/ Protection: None   Other Topics Concern  . Not on file   Social History Narrative   Lives at home with spouse.   Caffeine use: 1 or 2 per week (coffee)     PHYSICAL EXAM  Filed Vitals:   07/02/15 1545  BP: 129/80  Pulse: 71  Height: 5' 6.75" (1.695 m)  Weight: 127 lb 12.8 oz (57.97 kg)   Body mass index is 20.18 kg/(m^2).  Generalized: Well developed, in no acute distress  Head: normocephalic and atraumatic,. Oropharynx benign  Neck: Supple, no carotid bruits  Cardiac: Regular rate rhythm, no murmur  Musculoskeletal: No deformity   Neurological examination   Mentation: Alert oriented to time, place, history taking. Attention span and concentration appropriate. Recent and remote memory intact.  Follows all commands speech and language fluent.   Cranial nerve II-XII: Pupils were equal round reactive to light extraocular movements were full, visual field were full on confrontational test. Facial sensation and strength were normal. hearing was intact to finger rubbing bilaterally. Uvula tongue midline. head turning and shoulder shrug were normal and symmetric.Tongue protrusion into cheek strength was normal. Motor: normal bulk and tone, full strength in the BUE, BLE, fine finger movements normal, no pronator drift. No focal weakness Coordination: finger-nose-finger, heel-to-shin bilaterally, no dysmetria Reflexes: Brachioradialis 2/2, biceps 2/2, triceps 2/2, patellar 2/2, Achilles 2/2, plantar responses were flexor bilaterally. Gait and Station: Rising up from seated position without assistance, normal stance,   moderate stride, good arm swing, smooth turning, able to perform tiptoe, and heel walking without difficulty. Tandem gait is steady  DIAGNOSTIC DATA (LABS, IMAGING, TESTING) -  ASSESSMENT AND PLAN  44 y.o. year old female  has a past medical history of onset of left-sided headache with dizziness and vertigo and blurry vision initially. Her vertigo occurs every day. She has not had an MRI to rule out a central cause of vertigo such as pathologies affecting the brainstem or vestibular nerve vascular event involving the cerebellum. She has not tried her Xanax, patient relates that she does not take medications on a routine basis.  Will set up for vestibular rehabilitation Xanax one half tablets of the (.25) 3 times daily as needed Given teaching information on vertigo Recommend that she get MRI of the brain I spent additional 10 minutes in total face to face time with the patient more than 50% of which was spent counseling and coordination of care, reviewing test results reviewing medications and discussing and reviewing the diagnosis of vertigo and further treatment options. Follow up in 3-4 monthsVst time 25 min Nilda Riggs, [facility], [facility], APRN  [facility] [address] [place], [place] [address] [phone]  Personally  participated in and made any corrections needed to history, physical, neuro exam,assessment and plan as stated above. Discussed will also send to ENT for eval of BPPV.  Naomie Dean, MD [facility]

## 2015-07-02 NOTE — Addendum Note (Signed)
Addended by: Naomie Dean B on: 07/02/2015 06:14 PM   Modules accepted: Orders

## 2015-07-02 NOTE — Patient Instructions (Signed)
Will set up for vestibular rehabilitation Xanax one half tablets 3 times daily as needed Follow up in 3-4 months

## 2015-07-10 ENCOUNTER — Other Ambulatory Visit: Payer: Self-pay

## 2015-07-10 DIAGNOSIS — Z1231 Encounter for screening mammogram for malignant neoplasm of breast: Secondary | ICD-10-CM

## 2015-07-18 ENCOUNTER — Ambulatory Visit
Admission: RE | Admit: 2015-07-18 | Discharge: 2015-07-18 | Disposition: A | Discharge status: Home / Self Care | Payer: Managed Care, Other (non HMO) | Source: Ambulatory Visit

## 2015-07-18 DIAGNOSIS — Z1231 Encounter for screening mammogram for malignant neoplasm of breast: Secondary | ICD-10-CM

## 2015-07-25 ENCOUNTER — Ambulatory Visit (INDEPENDENT_AMBULATORY_CARE_PROVIDER_SITE_OTHER): Payer: Managed Care, Other (non HMO)

## 2015-07-25 DIAGNOSIS — R42 Dizziness and giddiness: Secondary | ICD-10-CM

## 2015-07-25 DIAGNOSIS — G4489 Other headache syndrome: Secondary | ICD-10-CM | POA: Diagnosis not present

## 2015-07-25 DIAGNOSIS — H538 Other visual disturbances: Secondary | ICD-10-CM

## 2015-07-26 ENCOUNTER — Telehealth: Payer: Self-pay | Admitting: Neurology

## 2015-07-26 NOTE — Telephone Encounter (Signed)
Called patient to let her know MRI of the brain is normal. Called home number twice and got a message that the wireless customer is not available, no voicemail option. Could not leave message at work.   Dr. Frances FurbishAthar : You are the work-in doctor tomorrow morning, Would you mind calling patient in the morning and letting her know that her MRI of the brain was normal? Thanks!

## 2015-07-26 NOTE — Telephone Encounter (Signed)
Patient is anxious to hear the results of her MRI done yesterday.   Best number to call is [phone]

## 2015-07-26 NOTE — Telephone Encounter (Signed)
Called pt back to let her know the doctor still needs to review the results and we will call her as soon as these come back. She understands. She is very anxious to find out results. She went and saw Dr. Haroldine Lawsrossley who diagnosed her with Benign positional vertigo. He told her he did everything on his end and she will have to go back to Dr. Lucia GaskinsAhern. She is going on 07/31/15 to physical therapy. She does not feel as dizzy as before, but she is still having symptoms. I told her I would give Dr. Lucia GaskinsAhern the message that she called. She verbalized understanding.

## 2015-07-27 NOTE — Telephone Encounter (Signed)
Tried calling, could not leave message. Kara MeadEmma or Lafonda MossesDiana, try again on Monday, please, thx

## 2015-07-30 ENCOUNTER — Telehealth: Payer: Self-pay | Admitting: *Deleted

## 2015-07-30 ENCOUNTER — Encounter: Payer: Self-pay | Admitting: *Deleted

## 2015-07-30 ENCOUNTER — Other Ambulatory Visit: Payer: Self-pay | Admitting: Neurology

## 2015-07-30 MED ORDER — MECLIZINE HCL 25 MG PO TABS: 25.0000 mg | ORAL_TABLET | Freq: Three times a day (TID) | ORAL | Status: AC | PRN

## 2015-07-30 NOTE — Telephone Encounter (Signed)
Helen Russell, can you let patient know we have tried to call her several times. The mri of her brain was normal. If we can't reach her can you send her a letter. MRI of the brain was normal thanks

## 2015-07-30 NOTE — Telephone Encounter (Signed)
Tried calling home number: "Wireless caller is not available. Please try again later". Unable to leave a message.   Tried calling work number, but no extension provided. Could not leave message. I will send a letter to the patient.

## 2015-07-30 NOTE — Telephone Encounter (Signed)
She is feeling better. i called in meclizine for her prn. thanks

## 2015-07-30 NOTE — Telephone Encounter (Signed)
Pt called office back. Advised that MRI normal and Dr. Lucia GaskinsAhern will call her back to discuss everything with her. She verbalized understanding.  She said she has appt with PT tomorrow at 330. Offered appt before that time and she declined. She is wondering if Dr. Lucia GaskinsAhern can call her.

## 2015-07-31 ENCOUNTER — Ambulatory Visit: Payer: Managed Care, Other (non HMO) | Attending: Nurse Practitioner | Admitting: Physical Therapy

## 2015-07-31 DIAGNOSIS — H8111 Benign paroxysmal vertigo, right ear: Secondary | ICD-10-CM

## 2015-08-01 ENCOUNTER — Encounter: Payer: Self-pay | Admitting: Physical Therapy

## 2015-08-01 NOTE — Patient Instructions (Signed)
Self Treatment for Right Posterior / Anterior Canalithiasis    Sitting on bed: 1. Turn head 45 right. (a) Lie back slowly, shoulders on pillow, head on bed. (b) Hold 30____ seconds. 2. Keeping head on bed, turn head 90 left. Hold _30___ seconds. 3. Roll to left, head on 45 angle down toward bed. Hold __30__ seconds. 4. Sit up on left side of bed. Repeat __3__ times per session. Do __2Benign Positional Vertigo Vertigo is the feeling that you or your surroundings are moving when they are not. Benign positional vertigo is the most common form of vertigo. The cause of this condition is not serious (is benign). This condition is triggered by certain movements and positions (is positional). This condition can be dangerous if it occurs while you are doing something that could endanger you or others, such as driving.  CAUSES In many cases, the cause of this condition is not known. It may be caused by a disturbance in an area of the inner ear that helps your brain to sense movement and balance. This disturbance can be caused by a viral infection (labyrinthitis), head injury, or repetitive motion. RISK FACTORS This condition is more likely to develop in:  Women.  People who are 76 years of age or older. SYMPTOMS Symptoms of this condition usually happen when you move your head or your eyes in different directions. Symptoms may start suddenly, and they usually last for less than a minute. Symptoms may include:  Loss of balance and falling.  Feeling like you are spinning or moving.  Feeling like your surroundings are spinning or moving.  Nausea and vomiting.  Blurred vision.  Dizziness.  Involuntary eye movement (nystagmus). Symptoms can be mild and cause only slight annoyance, or they can be severe and interfere with daily life. Episodes of benign positional vertigo may return (recur) over time, and they may be triggered by certain movements. Symptoms may improve over time. DIAGNOSIS This  condition is usually diagnosed by medical history and a physical exam of the head, neck, and ears. You may be referred to a health care provider who specializes in ear, nose, and throat (ENT) problems (otolaryngologist) or a provider who specializes in disorders of the nervous system (neurologist). You may have additional testing, including:  MRI.  A CT scan.  Eye movement tests. Your health care provider may ask you to change positions quickly while he or she watches you for symptoms of benign positional vertigo, such as nystagmus. Eye movement may be tested with an electronystagmogram (ENG), caloric stimulation, the Dix-Hallpike test, or the roll test.  An electroencephalogram (EEG). This records electrical activity in your brain.  Hearing tests. TREATMENT Usually, your health care provider will treat this by moving your head in specific positions to adjust your inner ear back to normal. Surgery may be needed in severe cases, but this is rare. In some cases, benign positional vertigo may resolve on its own in 2-4 weeks. HOME CARE INSTRUCTIONS Safety  Move slowly.Avoid sudden body or head movements.  Avoid driving.  Avoid operating heavy machinery.  Avoid doing any tasks that would be dangerous to you or others if a vertigo episode would occur.  If you have trouble walking or keeping your balance, try using a cane for stability. If you feel dizzy or unstable, sit down right away.  Return to your normal activities as told by your health care provider. Ask your health care provider what activities are safe for you. General Instructions  Take over-the-counter and prescription  medicines only as told by your health care provider.  Avoid certain positions or movements as told by your health care provider.  Drink enough fluid to keep your urine clear or pale yellow.  Keep all follow-up visits as told by your health care provider. This is important. SEEK MEDICAL CARE IF:  You have a  fever.  Your condition gets worse or you develop new symptoms.  Your family or friends notice any behavioral changes.  Your nausea or vomiting gets worse.  You have numbness or a "pins and needles" sensation. SEEK IMMEDIATE MEDICAL CARE IF:  You have difficulty speaking or moving.  You are always dizzy.  You faint.  You develop severe headaches.  You have weakness in your legs or arms.  You have changes in your hearing or vision.  You develop a stiff neck.  You develop sensitivity to light.   This information is not intended to replace advice given to you by your health care provider. Make sure you discuss any questions you have with your health care provider.   Document Released: 06/30/2006 Document Revised: 06/13/2015 Document Reviewed: 01/15/2015 Elsevier Interactive Patient Education [employer]2016 Elsevier Inc. __ sessions per day.  Copyright  VHI. All rights reserved.

## 2015-08-01 NOTE — Therapy (Signed)
[facility] [address] [place], [place], [address] Phone: [phone]   Fax:  [phone]  Physical Therapy Evaluation  Patient Details  Name: Alyxis Grippi MRN: [id number] Date of Birth: [birth year] Referring Provider: Darrol Angel, NP  Encounter Date: 07/31/2015      PT End of Session - 08/01/15 1816    Visit Number 1   Authorization Type [employer]   PT Start Time 1535   PT Stop Time 1620   PT Time Calculation (min) 45 min      Past Medical History  Diagnosis Date  . No pertinent past medical history   . Abnormal Pap smear     LEEP 1996    Past Surgical History  Procedure Laterality Date  . Leep      1996  . No past surgeries    . Esophagogastroduodenoscopy  08/17/2011    Procedure: ESOPHAGOGASTRODUODENOSCOPY (EGD);  Surgeon: Louis Meckel, MD;  Location: [facility];  Service: Endoscopy;  Laterality: N/A;    There were no vitals filed for this visit.  Visit Diagnosis:  BPPV (benign paroxysmal positional vertigo), right - Plan: PT plan of care cert/re-cert      Subjective Assessment - 08/01/15 1809    Subjective Pt reports she has spinning vertigo when turns over in bed onto R side to turn her alarm clock off in the mornings and also has vertigo when she lies down at night   Patient Stated Goals resolve the vertigo   Currently in Pain? No/denies            [facility] PT Assessment - 08/01/15 0001    Assessment   Medical Diagnosis BPPV   Referring Provider Darrol Angel, NP   Onset Date/Surgical Date --  May 2016   Prior Therapy None   Balance Screen   Has the patient fallen in the past 6 months No   Has the patient had a decrease in activity level because of a fear of falling?  No   Is the patient reluctant to leave their home because of a fear of falling?  No   Prior Function   Level of Independence Independent   Vocation Full time employment   Vocation Requirements alot of visual  scanning and tracking required - pt works for a [employer]            Vestibular Assessment - 08/01/15 0001    Vestibular Assessment   General Observation Pt is a 44 year old female with c/o intermittent vertigo with position changes   Symptom Behavior   Type of Dizziness Spinning   Frequency of Dizziness varies   Duration of Dizziness seconds   Aggravating Factors Turning body quickly;Rolling to right   Relieving Factors Rest;Lying supine   Occulomotor Exam   Occulomotor Alignment Normal   Smooth Pursuits Intact   Saccades Intact   Positional Testing   Dix-Hallpike Dix-Hallpike Right   Sidelying Test Sidelying Right   Dix-Hallpike Right   Dix-Hallpike Right Duration about 5 secs   Dix-Hallpike Right Symptoms Upbeat, right rotatory nystagmus   Sidelying Right   Sidelying Right Duration about 7-9 secs   Sidelying Right Symptoms Upbeat, right rotatory nystagmus        Epley maneuver performed 3 reps for R BPPV - no c/o vertigo and no nystagmus noted on 3rd rep  Instructed pt in self treatment of canalith repositioning maneuver and Brandt-Daroff exercises if vertigo not fully resolved-  pt does not feel that another PT  appt needed due to significant improvement in symptoms after 3 reps of Epley maneuver               PT Education - 08/01/15 1815    Education provided Yes   Education Details Epley maneuver for self treatment; BPPV information   Person(s) Educated Patient   Methods Explanation;Demonstration;Handout   Comprehension Verbalized understanding;Returned demonstration                    Plan - 08/01/15 1816    Clinical Impression Statement Pt has signs and symptoms consistent with R BPPV - posterior canalithiasis; no symptoms on 3rd rep of Epley maneuver - vertigo appears to have resolved with treatment   Pt will benefit from skilled therapeutic intervention in order to improve on the following deficits Dizziness   Rehab Potential  Good   PT Frequency One time visit  eval + treatment in 1 session   PT Treatment/Interventions Canalith Repostioning;Patient/family education;ADLs/Self Care Home Management   PT Next Visit Plan N/A - pt does not feel that another PT visit is needed due to no c/o vertigo after treatment   PT Home Exercise Plan self treatment - Epley maneuver if needed   Consulted and Agree with Plan of Care Patient         Problem List Patient Active Problem List   Diagnosis Date Noted  . Vertigo 03/19/2015  . Blurry vision 03/19/2015  . Headache 03/19/2015    Kary Kosilday, Rubie Ficco Suzanne, PT 08/01/2015, 6:23 PM  [facility] [address] [place], [place], [address] Phone: [phone]   Fax:  [phone]  Name: Wilhelmenia BlaseHeather Halteman MRN: [id number] Date of Birth: [birth year]

## 2015-08-10 ENCOUNTER — Other Ambulatory Visit: Payer: Self-pay | Admitting: Physician Assistant

## 2015-08-10 ENCOUNTER — Other Ambulatory Visit (HOSPITAL_COMMUNITY)
Admission: RE | Admit: 2015-08-10 | Discharge: 2015-08-10 | Disposition: A | Discharge status: Home / Self Care | Payer: Managed Care, Other (non HMO) | Source: Ambulatory Visit | Attending: Family Medicine | Admitting: Family Medicine

## 2015-08-10 DIAGNOSIS — Z124 Encounter for screening for malignant neoplasm of cervix: Secondary | ICD-10-CM | POA: Insufficient documentation

## 2015-08-15 LAB — CYTOLOGY - PAP

## 2015-10-25 ENCOUNTER — Ambulatory Visit (INDEPENDENT_AMBULATORY_CARE_PROVIDER_SITE_OTHER): Payer: Managed Care, Other (non HMO) | Admitting: Nurse Practitioner

## 2015-10-25 ENCOUNTER — Encounter: Payer: Self-pay | Admitting: Nurse Practitioner

## 2015-10-25 VITALS — BP 132/86 | HR 67 | Ht 66.5 in | Wt 128.4 lb

## 2015-10-25 DIAGNOSIS — G4489 Other headache syndrome: Secondary | ICD-10-CM | POA: Diagnosis not present

## 2015-10-25 DIAGNOSIS — R42 Dizziness and giddiness: Secondary | ICD-10-CM | POA: Diagnosis not present

## 2015-10-25 NOTE — Progress Notes (Addendum)
[facility]  PATIENT: Helen Russell DOB: 1971-04-05   REASON FOR VISIT:follow up for dizziness HISTORY FROM: Patient    HISTORY OF PRESENT ILLNESS: Helen Russell, 45 year old female returns for follow-up. When last seen on 07/02/2015 her headaches were better however her dizziness persisted and it was difficult for her to do her job she was feeling off-balance and she had some neck pain. She was having daily episodes of dizziness and vertigo with room spinning at that time. MRI of the brain was normal she participated in vestibular rehabilitation which was extremely beneficial. She has not had further episodes of vertigo. Her headaches are in excellent control she returns for reevaluation  UPDATE 07/02/15 Helen Russell, 45 year old female returns for follow-up. She reports that her headache is a little better but is still present and the dizziness persists. She has continued to work full-time, she works 7 days week.Very difficult for her to do her job, feels off balance. She has neck pain as well. Daily episodes of dizziness and vertigo with room spinning. She feels off balance like she is going to fall. Denies any visual symptoms. She has not obtained MRI of the brain. She does not particularly want to take medications long-term. She has not even tried Xanax to take when necessary. She returns for reevaluation   Initial visit 03/19/2015: Helen Russell is a 45 y.o. female here as a referral from Dr. Valentina Lucks for headache. She has pain behind her left eye. Started a month ago after her prescription for her eyes changed. She started getting dizzy and the whole room shifted when she got up. She is having daily episodes of room spinning. When she rolls over to the right, she can feel her symptoms. Feels like she is on a boat. She has an uncomfortable feeling on the left side of the head, she feels off balance, she has neck tightness and soreness and heaviness in her neck. The  headache comes and goes daily. Sometimes it feels like her head is going to pop. No photophobia, no phonophobia. More pressure in the eye. The dizziness is moreso when changing positions. Like when she gets up very fast. Vertigo is brief. No nausea, no hearing changes, +blurry and vision changes. She feels like she is still on the airplane, off balance. No inciting events other than change in vision as stated above. No family members with migraines. No weakness. No paresthesias. The meclizine didn't help. No other focal neurologic symptoms. She is under stress at work and is not sleeping well.     REVIEW OF SYSTEMS: Full 14 system review of systems performed and notable only for those listed, all others are neg:  Constitutional: neg  Cardiovascular: neg Ear/Nose/Throat: neg  Skin: neg Eyes: neg Respiratory: neg Gastroitestinal: neg  Hematology/Lymphatic: neg  Endocrine: neg Musculoskeletal:neg Allergy/Immunology: neg Neurological: neg Psychiatric: neg Sleep : neg   ALLERGIES: Allergies  Allergen Reactions  . Ethanol     Can not ingest products with ethanol/alcohol in them  . Rubbing Alcohol [Alcohol] Rash    HOME MEDICATIONS: Outpatient Prescriptions Prior to Visit  Medication Sig Dispense Refill  . SUMAtriptan-naproxen (TREXIMET) 85-500 MG per tablet Take 1 tablet by mouth every 2 (two) hours as needed for migraine. 2 tablet 0  . ALPRAZolam (XANAX) 0.25 MG tablet Take 1 tablet (0.25 mg total) by mouth 3 (three) times daily as needed for anxiety. (Patient not taking: Reported on 07/02/2015) 60 tablet 1  . meclizine (ANTIVERT) 25 MG tablet Take 1 tablet (25  mg total) by mouth 3 (three) times daily as needed for dizziness. (Patient not taking: Reported on 10/25/2015) 90 tablet 11   No facility-administered medications prior to visit.    PAST MEDICAL HISTORY: Past Medical History  Diagnosis Date  . No pertinent past medical history   . Abnormal Pap smear     LEEP 1996     PAST SURGICAL HISTORY: Past Surgical History  Procedure Laterality Date  . Leep      1996  . No past surgeries    . Esophagogastroduodenoscopy  08/17/2011    Procedure: ESOPHAGOGASTRODUODENOSCOPY (EGD);  Surgeon: Louis Meckel, MD;  Location: [facility];  Service: Endoscopy;  Laterality: N/A;    FAMILY HISTORY: Family History  Problem Relation Age of Onset  . Diabetes Mother   . High blood pressure Mother   . Diverticulitis Mother   . Migraines Neg Hx     SOCIAL HISTORY: Social History   Social History  . Marital Status: Married    Spouse Name: N/A  . Number of Children: 1  . Years of Education: 12   Occupational History  . [employer]     Social History Main Topics  . Smoking status: Never Smoker   . Smokeless tobacco: Never Used  . Alcohol Use: No  . Drug Use: No  . Sexual Activity: Yes    Birth Control/ Protection: None   Other Topics Concern  . Not on file   Social History Narrative   Lives at home with spouse.   Caffeine use: 1 or 2 per week (coffee)     PHYSICAL EXAM  Filed Vitals:   10/25/15 1549  BP: 132/86  Pulse: 67  Height: 5' 6.5" (1.689 m)  Weight: 128 lb 6.4 oz (58.242 kg)   Body mass index is 20.42 kg/(m^2). Generalized: Well developed, in no acute distress  Head: normocephalic and atraumatic,. Oropharynx benign  Neck: Supple, no carotid bruits  Cardiac: Regular rate rhythm, no murmur  Musculoskeletal: No deformity   Neurological examination   Mentation: Alert oriented to time, place, history taking. Attention span and concentration appropriate. Recent and remote memory intact. Follows all commands speech and language fluent.   Cranial nerve II-XII: Pupils were equal round reactive to light extraocular movements were full, visual field were full on confrontational test. Facial sensation and strength were normal. hearing was intact to finger rubbing bilaterally. Uvula tongue midline. head turning and shoulder shrug were  normal and symmetric.Tongue protrusion into cheek strength was normal. Motor: normal bulk and tone, full strength in the BUE, BLE, fine finger movements normal, no pronator drift. No focal weakness Coordination: finger-nose-finger, heel-to-shin bilaterally, no dysmetria Reflexes: Brachioradialis 2/2, biceps 2/2, triceps 2/2, patellar 2/2, Achilles 2/2, plantar responses were flexor bilaterally. Gait and Station: Rising up from seated position without assistance, normal stance, moderate stride, good arm swing, smooth turning, able to perform tiptoe, and heel walking without difficulty. Tandem gait is steady   DIAGNOSTIC DATA (LABS, IMAGING, TESTING)   ASSESSMENT AND PLAN  45 y.o. year old female  has a past medical history of headaches and vertigo. MRI of the brain was normal she participated in vestibular rehabilitation and has not had further episodes of vertigo.  Continue vestibular exercises HEP as necessary F/U  prn Nilda Riggs, [facility], [facility], APRN  [facility] [address] [place], [place] [address] [phone]  Personally examined images, and have participated in and made any corrections needed to history, physical, neuro exam,assessment and plan as  stated above.  I have personally reviewed the history, evaluated lab date, reviewed imaging studies and agree with radiology interpretations.    Naomie Dean, MD [facility]

## 2015-10-25 NOTE — Patient Instructions (Signed)
Continue vestibular exercises as necessary F/U  prn
